# Patient Record
Sex: Female | Born: 1955 | Race: Black or African American | Hispanic: No | Marital: Married | Smoking: Current some day smoker
Health system: Southern US, Community
[De-identification: ages and names within clinical notes are randomized; demographics above are authoritative.]

## PROBLEM LIST (undated history)

## (undated) DIAGNOSIS — R42 Dizziness and giddiness: Secondary | ICD-10-CM

## (undated) DIAGNOSIS — I639 Cerebral infarction, unspecified: Secondary | ICD-10-CM

## (undated) DIAGNOSIS — M5136 Other intervertebral disc degeneration, lumbar region: Secondary | ICD-10-CM

## (undated) DIAGNOSIS — I10 Essential (primary) hypertension: Secondary | ICD-10-CM

---

## 1998-09-05 ENCOUNTER — Ambulatory Visit (HOSPITAL_COMMUNITY): Admission: RE | Admit: 1998-09-05 | Discharge: 1998-09-05 | Payer: Self-pay | Admitting: Family Medicine

## 1998-09-05 ENCOUNTER — Encounter: Payer: Self-pay | Admitting: Family Medicine

## 1998-11-08 ENCOUNTER — Encounter: Payer: Self-pay | Admitting: Family Medicine

## 1998-11-08 ENCOUNTER — Ambulatory Visit (HOSPITAL_COMMUNITY): Admission: RE | Admit: 1998-11-08 | Discharge: 1998-11-08 | Payer: Self-pay | Admitting: Family Medicine

## 1998-11-21 ENCOUNTER — Ambulatory Visit (HOSPITAL_COMMUNITY): Admission: RE | Admit: 1998-11-21 | Discharge: 1998-11-21 | Payer: Self-pay | Admitting: Family Medicine

## 1998-11-21 ENCOUNTER — Encounter: Payer: Self-pay | Admitting: Family Medicine

## 1999-02-23 ENCOUNTER — Emergency Department (HOSPITAL_COMMUNITY): Admission: EM | Admit: 1999-02-23 | Discharge: 1999-02-23 | Payer: Self-pay | Admitting: Emergency Medicine

## 1999-05-28 ENCOUNTER — Ambulatory Visit (HOSPITAL_COMMUNITY): Admission: RE | Admit: 1999-05-28 | Discharge: 1999-05-28 | Payer: Self-pay | Admitting: Family Medicine

## 1999-05-28 ENCOUNTER — Encounter: Payer: Self-pay | Admitting: Family Medicine

## 2001-11-09 ENCOUNTER — Encounter: Payer: Self-pay | Admitting: Emergency Medicine

## 2001-11-09 ENCOUNTER — Emergency Department (HOSPITAL_COMMUNITY): Admission: EM | Admit: 2001-11-09 | Discharge: 2001-11-09 | Payer: Self-pay | Admitting: Emergency Medicine

## 2002-08-19 ENCOUNTER — Emergency Department (HOSPITAL_COMMUNITY): Admission: EM | Admit: 2002-08-19 | Discharge: 2002-08-19 | Payer: Self-pay | Admitting: *Deleted

## 2002-08-19 ENCOUNTER — Encounter: Payer: Self-pay | Admitting: *Deleted

## 2002-09-24 ENCOUNTER — Encounter: Payer: Self-pay | Admitting: Emergency Medicine

## 2002-09-25 ENCOUNTER — Inpatient Hospital Stay (HOSPITAL_COMMUNITY): Admission: EM | Admit: 2002-09-25 | Discharge: 2002-09-27 | Payer: Self-pay | Admitting: Emergency Medicine

## 2002-09-25 ENCOUNTER — Encounter: Payer: Self-pay | Admitting: Family Medicine

## 2002-10-31 ENCOUNTER — Ambulatory Visit (HOSPITAL_COMMUNITY): Admission: RE | Admit: 2002-10-31 | Discharge: 2002-10-31 | Payer: Self-pay | Admitting: Family Medicine

## 2002-10-31 ENCOUNTER — Encounter: Payer: Self-pay | Admitting: Family Medicine

## 2002-11-03 ENCOUNTER — Emergency Department (HOSPITAL_COMMUNITY): Admission: EM | Admit: 2002-11-03 | Discharge: 2002-11-03 | Payer: Self-pay | Admitting: Emergency Medicine

## 2002-11-05 ENCOUNTER — Encounter: Payer: Self-pay | Admitting: Emergency Medicine

## 2002-11-05 ENCOUNTER — Emergency Department (HOSPITAL_COMMUNITY): Admission: EM | Admit: 2002-11-05 | Discharge: 2002-11-05 | Payer: Self-pay | Admitting: Emergency Medicine

## 2002-11-09 ENCOUNTER — Inpatient Hospital Stay (HOSPITAL_COMMUNITY): Admission: EM | Admit: 2002-11-09 | Discharge: 2002-11-13 | Payer: Self-pay | Admitting: Emergency Medicine

## 2002-11-09 ENCOUNTER — Encounter: Payer: Self-pay | Admitting: Emergency Medicine

## 2002-11-10 ENCOUNTER — Encounter: Payer: Self-pay | Admitting: Internal Medicine

## 2002-11-17 ENCOUNTER — Encounter: Admission: RE | Admit: 2002-11-17 | Discharge: 2002-11-17 | Payer: Self-pay | Admitting: Family Medicine

## 2002-11-17 ENCOUNTER — Emergency Department (HOSPITAL_COMMUNITY): Admission: EM | Admit: 2002-11-17 | Discharge: 2002-11-17 | Payer: Self-pay

## 2002-11-17 ENCOUNTER — Encounter: Admission: EM | Admit: 2002-11-17 | Discharge: 2002-11-17 | Payer: Self-pay | Admitting: Dentistry

## 2002-11-19 ENCOUNTER — Emergency Department (HOSPITAL_COMMUNITY): Admission: EM | Admit: 2002-11-19 | Discharge: 2002-11-19 | Payer: Self-pay | Admitting: Emergency Medicine

## 2002-11-19 ENCOUNTER — Encounter: Payer: Self-pay | Admitting: Emergency Medicine

## 2002-11-24 ENCOUNTER — Encounter: Admission: RE | Admit: 2002-11-24 | Discharge: 2002-11-24 | Payer: Self-pay | Admitting: Family Medicine

## 2002-11-24 ENCOUNTER — Encounter: Payer: Self-pay | Admitting: Family Medicine

## 2002-11-30 ENCOUNTER — Encounter: Payer: Self-pay | Admitting: Family Medicine

## 2002-11-30 ENCOUNTER — Encounter: Admission: RE | Admit: 2002-11-30 | Discharge: 2002-11-30 | Payer: Self-pay | Admitting: Family Medicine

## 2002-12-14 ENCOUNTER — Encounter: Payer: Self-pay | Admitting: Family Medicine

## 2002-12-14 ENCOUNTER — Encounter: Admission: RE | Admit: 2002-12-14 | Discharge: 2002-12-14 | Payer: Self-pay | Admitting: Family Medicine

## 2003-01-03 ENCOUNTER — Encounter: Payer: Self-pay | Admitting: Family Medicine

## 2003-01-03 ENCOUNTER — Encounter: Admission: RE | Admit: 2003-01-03 | Discharge: 2003-01-03 | Payer: Self-pay | Admitting: Family Medicine

## 2003-01-13 ENCOUNTER — Emergency Department (HOSPITAL_COMMUNITY): Admission: EM | Admit: 2003-01-13 | Discharge: 2003-01-13 | Payer: Self-pay | Admitting: Emergency Medicine

## 2003-01-13 ENCOUNTER — Encounter: Payer: Self-pay | Admitting: Emergency Medicine

## 2003-01-25 ENCOUNTER — Emergency Department (HOSPITAL_COMMUNITY): Admission: EM | Admit: 2003-01-25 | Discharge: 2003-01-25 | Payer: Self-pay | Admitting: Emergency Medicine

## 2003-01-31 ENCOUNTER — Encounter: Payer: Self-pay | Admitting: Family Medicine

## 2003-01-31 ENCOUNTER — Encounter: Admission: RE | Admit: 2003-01-31 | Discharge: 2003-01-31 | Payer: Self-pay | Admitting: Family Medicine

## 2003-02-02 ENCOUNTER — Emergency Department (HOSPITAL_COMMUNITY): Admission: EM | Admit: 2003-02-02 | Discharge: 2003-02-02 | Payer: Self-pay | Admitting: Emergency Medicine

## 2003-02-20 ENCOUNTER — Emergency Department (HOSPITAL_COMMUNITY): Admission: EM | Admit: 2003-02-20 | Discharge: 2003-02-20 | Payer: Self-pay | Admitting: Emergency Medicine

## 2003-02-22 ENCOUNTER — Ambulatory Visit (HOSPITAL_COMMUNITY): Admission: RE | Admit: 2003-02-22 | Discharge: 2003-02-22 | Payer: Self-pay | Admitting: Neurology

## 2003-03-16 ENCOUNTER — Ambulatory Visit (HOSPITAL_COMMUNITY): Admission: RE | Admit: 2003-03-16 | Discharge: 2003-03-16 | Payer: Self-pay | Admitting: Family Medicine

## 2003-03-16 ENCOUNTER — Encounter: Payer: Self-pay | Admitting: Family Medicine

## 2003-06-19 ENCOUNTER — Inpatient Hospital Stay (HOSPITAL_COMMUNITY): Admission: AD | Admit: 2003-06-19 | Discharge: 2003-06-20 | Payer: Self-pay | Admitting: Obstetrics and Gynecology

## 2003-06-19 ENCOUNTER — Encounter: Payer: Self-pay | Admitting: Emergency Medicine

## 2003-06-26 ENCOUNTER — Inpatient Hospital Stay (HOSPITAL_COMMUNITY): Admission: AD | Admit: 2003-06-26 | Discharge: 2003-06-26 | Payer: Self-pay | Admitting: Obstetrics & Gynecology

## 2003-06-27 ENCOUNTER — Ambulatory Visit: Admission: RE | Admit: 2003-06-27 | Discharge: 2003-06-27 | Payer: Self-pay | Admitting: Gynecology

## 2003-07-04 ENCOUNTER — Encounter (INDEPENDENT_AMBULATORY_CARE_PROVIDER_SITE_OTHER): Payer: Self-pay | Admitting: Specialist

## 2003-07-04 ENCOUNTER — Inpatient Hospital Stay (HOSPITAL_COMMUNITY): Admission: RE | Admit: 2003-07-04 | Discharge: 2003-07-07 | Payer: Self-pay | Admitting: Obstetrics & Gynecology

## 2003-07-11 ENCOUNTER — Inpatient Hospital Stay (HOSPITAL_COMMUNITY): Admission: AD | Admit: 2003-07-11 | Discharge: 2003-07-11 | Payer: Self-pay | Admitting: *Deleted

## 2003-07-19 ENCOUNTER — Inpatient Hospital Stay (HOSPITAL_COMMUNITY): Admission: AD | Admit: 2003-07-19 | Discharge: 2003-07-19 | Payer: Self-pay | Admitting: Obstetrics and Gynecology

## 2003-07-28 ENCOUNTER — Encounter: Admission: RE | Admit: 2003-07-28 | Discharge: 2003-07-28 | Payer: Self-pay | Admitting: Family Medicine

## 2003-09-13 ENCOUNTER — Inpatient Hospital Stay (HOSPITAL_COMMUNITY): Admission: AD | Admit: 2003-09-13 | Discharge: 2003-09-13 | Payer: Self-pay | Admitting: Obstetrics & Gynecology

## 2003-10-25 ENCOUNTER — Emergency Department (HOSPITAL_COMMUNITY): Admission: EM | Admit: 2003-10-25 | Discharge: 2003-10-25 | Payer: Self-pay | Admitting: *Deleted

## 2003-10-31 ENCOUNTER — Ambulatory Visit (HOSPITAL_COMMUNITY): Admission: RE | Admit: 2003-10-31 | Discharge: 2003-10-31 | Payer: Self-pay | Admitting: Family Medicine

## 2003-11-03 ENCOUNTER — Emergency Department (HOSPITAL_COMMUNITY): Admission: EM | Admit: 2003-11-03 | Discharge: 2003-11-04 | Payer: Self-pay | Admitting: Emergency Medicine

## 2003-11-19 ENCOUNTER — Emergency Department (HOSPITAL_COMMUNITY): Admission: EM | Admit: 2003-11-19 | Discharge: 2003-11-19 | Payer: Self-pay | Admitting: Emergency Medicine

## 2003-11-22 ENCOUNTER — Emergency Department (HOSPITAL_COMMUNITY): Admission: EM | Admit: 2003-11-22 | Discharge: 2003-11-22 | Payer: Self-pay | Admitting: Emergency Medicine

## 2003-12-04 ENCOUNTER — Emergency Department (HOSPITAL_COMMUNITY): Admission: EM | Admit: 2003-12-04 | Discharge: 2003-12-04 | Payer: Self-pay | Admitting: Emergency Medicine

## 2003-12-07 ENCOUNTER — Encounter: Admission: RE | Admit: 2003-12-07 | Discharge: 2003-12-07 | Payer: Self-pay | Admitting: Family Medicine

## 2003-12-11 ENCOUNTER — Emergency Department (HOSPITAL_COMMUNITY): Admission: EM | Admit: 2003-12-11 | Discharge: 2003-12-12 | Payer: Self-pay

## 2003-12-15 ENCOUNTER — Emergency Department (HOSPITAL_COMMUNITY): Admission: EM | Admit: 2003-12-15 | Discharge: 2003-12-16 | Payer: Self-pay | Admitting: Emergency Medicine

## 2004-01-04 ENCOUNTER — Encounter: Admission: RE | Admit: 2004-01-04 | Discharge: 2004-02-06 | Payer: Self-pay | Admitting: Family Medicine

## 2004-01-14 ENCOUNTER — Emergency Department (HOSPITAL_COMMUNITY): Admission: EM | Admit: 2004-01-14 | Discharge: 2004-01-14 | Payer: Self-pay | Admitting: Emergency Medicine

## 2004-01-24 ENCOUNTER — Emergency Department (HOSPITAL_COMMUNITY): Admission: EM | Admit: 2004-01-24 | Discharge: 2004-01-25 | Payer: Self-pay | Admitting: Emergency Medicine

## 2004-01-26 ENCOUNTER — Emergency Department (HOSPITAL_COMMUNITY): Admission: EM | Admit: 2004-01-26 | Discharge: 2004-01-26 | Payer: Self-pay

## 2004-01-26 ENCOUNTER — Ambulatory Visit: Payer: Self-pay | Admitting: Family Medicine

## 2004-01-30 ENCOUNTER — Ambulatory Visit: Payer: Self-pay | Admitting: Family Medicine

## 2004-01-30 ENCOUNTER — Ambulatory Visit: Payer: Self-pay | Admitting: *Deleted

## 2004-02-01 ENCOUNTER — Emergency Department (HOSPITAL_COMMUNITY): Admission: EM | Admit: 2004-02-01 | Discharge: 2004-02-01 | Payer: Self-pay | Admitting: Emergency Medicine

## 2004-02-02 ENCOUNTER — Ambulatory Visit (HOSPITAL_COMMUNITY): Admission: RE | Admit: 2004-02-02 | Discharge: 2004-02-02 | Payer: Self-pay | Admitting: Family Medicine

## 2004-02-11 ENCOUNTER — Emergency Department (HOSPITAL_COMMUNITY): Admission: EM | Admit: 2004-02-11 | Discharge: 2004-02-12 | Payer: Self-pay | Admitting: Emergency Medicine

## 2004-02-15 ENCOUNTER — Encounter: Admission: RE | Admit: 2004-02-15 | Discharge: 2004-04-12 | Payer: Self-pay | Admitting: Family Medicine

## 2004-02-16 ENCOUNTER — Emergency Department (HOSPITAL_COMMUNITY): Admission: EM | Admit: 2004-02-16 | Discharge: 2004-02-16 | Payer: Self-pay | Admitting: Emergency Medicine

## 2004-02-19 ENCOUNTER — Emergency Department (HOSPITAL_COMMUNITY): Admission: EM | Admit: 2004-02-19 | Discharge: 2004-02-20 | Payer: Self-pay | Admitting: *Deleted

## 2004-02-22 ENCOUNTER — Emergency Department (HOSPITAL_COMMUNITY): Admission: EM | Admit: 2004-02-22 | Discharge: 2004-02-22 | Payer: Self-pay | Admitting: Family Medicine

## 2004-02-24 ENCOUNTER — Emergency Department (HOSPITAL_COMMUNITY): Admission: EM | Admit: 2004-02-24 | Discharge: 2004-02-24 | Payer: Self-pay | Admitting: Family Medicine

## 2004-04-12 ENCOUNTER — Ambulatory Visit: Payer: Self-pay | Admitting: Family Medicine

## 2004-05-24 ENCOUNTER — Emergency Department (HOSPITAL_COMMUNITY): Admission: EM | Admit: 2004-05-24 | Discharge: 2004-05-24 | Payer: Self-pay | Admitting: *Deleted

## 2004-06-14 ENCOUNTER — Ambulatory Visit: Payer: Self-pay | Admitting: Family Medicine

## 2004-06-21 ENCOUNTER — Ambulatory Visit: Payer: Self-pay | Admitting: Family Medicine

## 2004-06-24 ENCOUNTER — Ambulatory Visit: Payer: Self-pay | Admitting: Family Medicine

## 2004-07-02 ENCOUNTER — Ambulatory Visit: Payer: Self-pay | Admitting: Family Medicine

## 2004-07-08 ENCOUNTER — Emergency Department (HOSPITAL_COMMUNITY): Admission: EM | Admit: 2004-07-08 | Discharge: 2004-07-08 | Payer: Self-pay | Admitting: Emergency Medicine

## 2004-07-10 ENCOUNTER — Ambulatory Visit: Payer: Self-pay | Admitting: Family Medicine

## 2004-07-11 ENCOUNTER — Ambulatory Visit (HOSPITAL_COMMUNITY): Admission: RE | Admit: 2004-07-11 | Discharge: 2004-07-11 | Payer: Self-pay | Admitting: Family Medicine

## 2004-07-11 ENCOUNTER — Emergency Department (HOSPITAL_COMMUNITY): Admission: EM | Admit: 2004-07-11 | Discharge: 2004-07-11 | Payer: Self-pay | Admitting: *Deleted

## 2004-07-17 ENCOUNTER — Ambulatory Visit: Payer: Self-pay | Admitting: Internal Medicine

## 2004-07-17 ENCOUNTER — Observation Stay (HOSPITAL_COMMUNITY): Admission: EM | Admit: 2004-07-17 | Discharge: 2004-07-18 | Payer: Self-pay | Admitting: Emergency Medicine

## 2004-07-18 ENCOUNTER — Encounter (INDEPENDENT_AMBULATORY_CARE_PROVIDER_SITE_OTHER): Payer: Self-pay | Admitting: *Deleted

## 2004-07-23 ENCOUNTER — Ambulatory Visit: Payer: Self-pay | Admitting: Family Medicine

## 2004-08-09 ENCOUNTER — Emergency Department (HOSPITAL_COMMUNITY): Admission: EM | Admit: 2004-08-09 | Discharge: 2004-08-09 | Payer: Self-pay | Admitting: Family Medicine

## 2004-08-15 IMAGING — US US TRANSVAGINAL NON-OB
1 series · 17 of 25 positions shown · non-contrast
Comparison: none

CLINICAL DATA: Left sided pelvic mass.  
TRANSABDOMINAL AND ENDOVAGINAL PELVIC ULTRASOUND:
Correlation is made with the previous CT on 06/19/03.
Multiple images of the pelvis were obtained using a transabdominal and endovaginal approaches.   The patient is status post hysterectomy in 8348 and right oophorectomy in 1334.  The patient currently has left lower quadrant pain.
There is a complex left adnexal mass identified which correlates with the CT finding and measures 9.6 x 10.0 x 7.6 cm.  This contains multiple thin septations.  Color flow is identified within the septations and we were able to measure a resistive index on one of the septations which measures .67.  This would be more compatible with a benign etiology.  There is a solitary mural nodule identified which measures 2.2 x 2.1 x 1.6 cm and appears contiguous with one of the septations.  No color flow was identified within this area of nodularity to allow interrogation.  Separate left ovary could not be identified and while no surrounding normal ovarian tissue can be clearly demonstrated, the location would suggest that this is likely ovarian in etiology.  While the resistive index in the septations is reassuring, because of the presence of a mural soft tissue nodule combined with the large size and complex nature, further evaluation with surgical assessment would be recommended.  Differential considerations for this finding would include mucinous or serous cystadenoma/cystadenocarcinoma or atypical dermoid.  Non gynecological etiologies are felt less likely given the appearance by ultrasound and CT, but a postoperative process such as an atypical lympocele would be a consideration as well.
IMPRESSION
Complex left adnexal mass as described above.  Sonographic features are felt most likely to be related to the left ovary and are concerning for the presence of an underlying neoplastic process and because of size and the presence of mural nodularity, correlation with [id number] and consideration for surgical evaluation would be recommended.

[Series 1: us transvaginal non-ob · 17 of 58 slices shown]
[im 1/58]
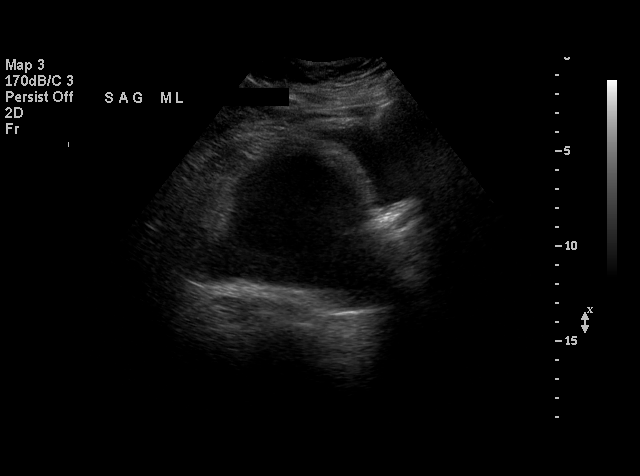
[im 5/58]
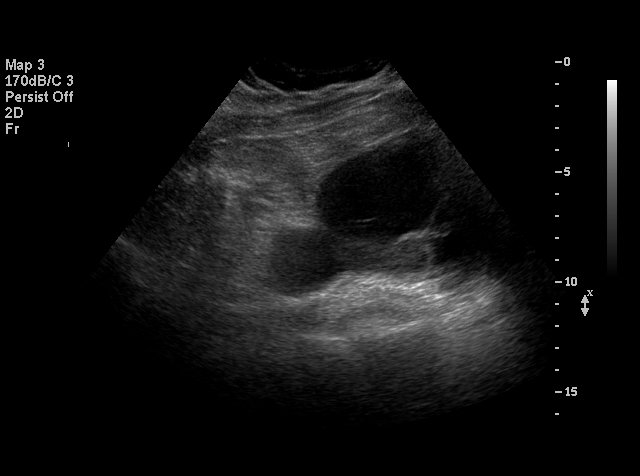
[im 8/58]
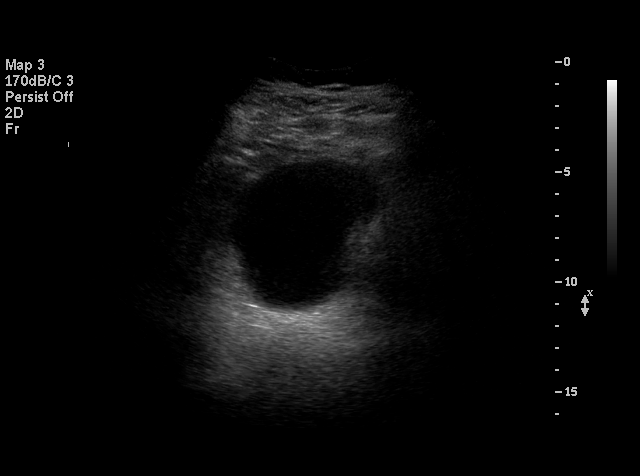
[im 12/58]
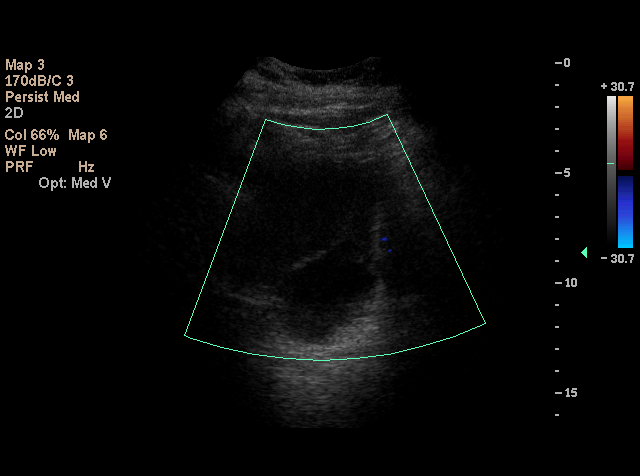
[im 15/58]
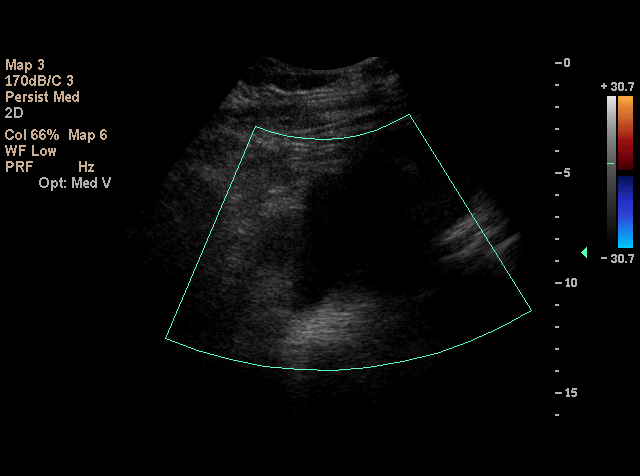
[im 20/58]
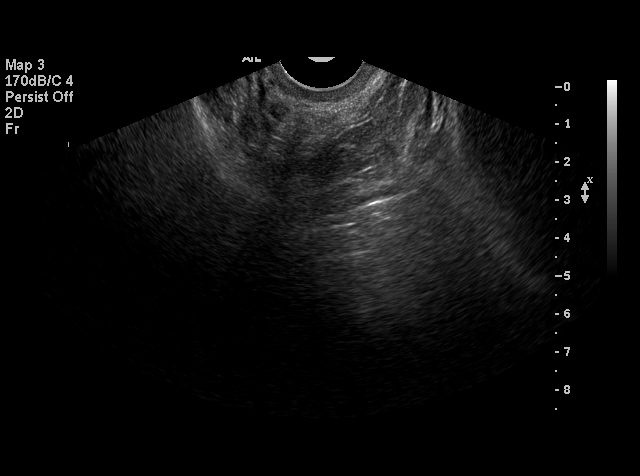
[im 22/58]
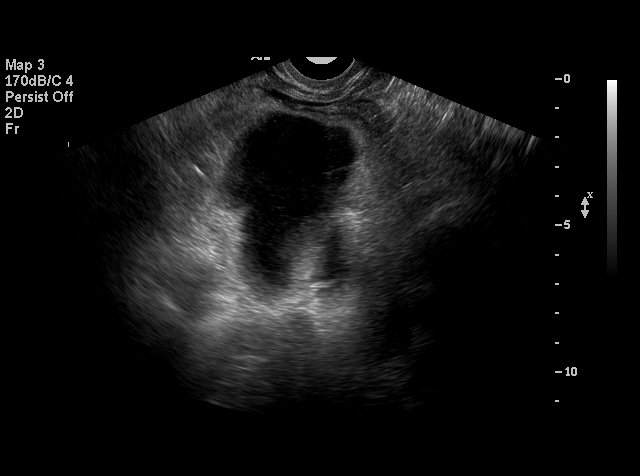
[im 27/58]
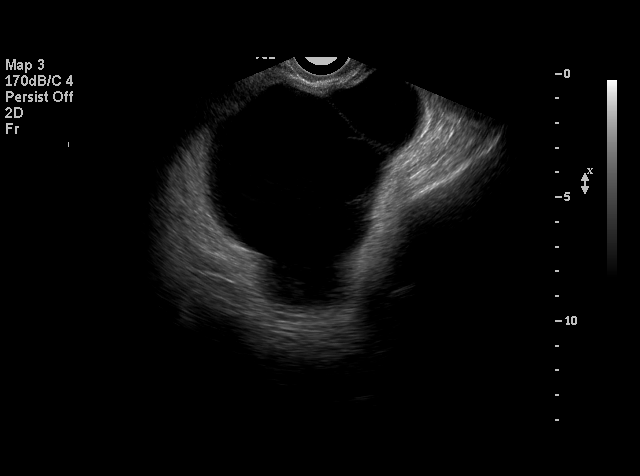
[im 29/58]
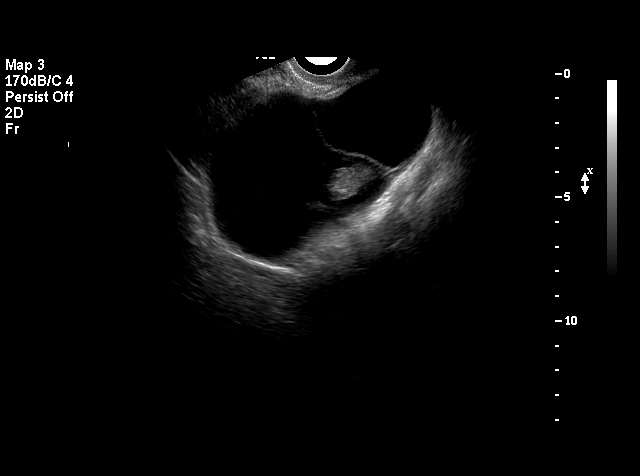
[im 31/58]
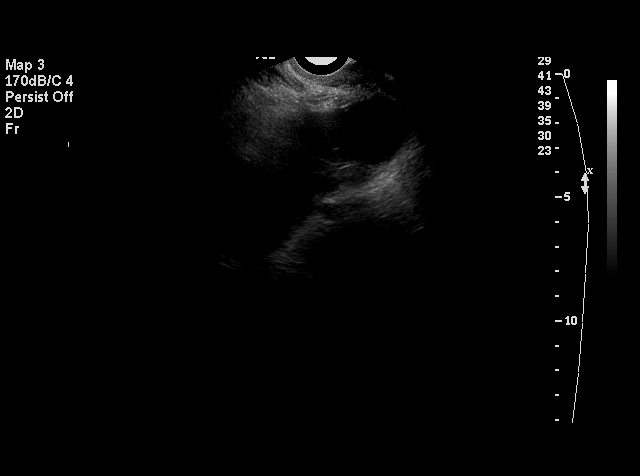
[im 36/58]
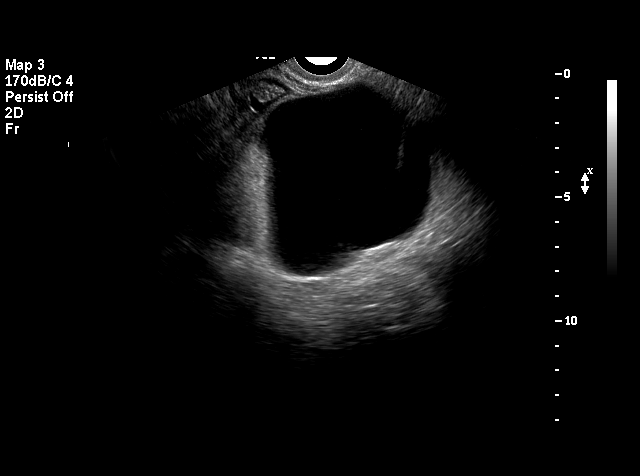
[im 39/58]
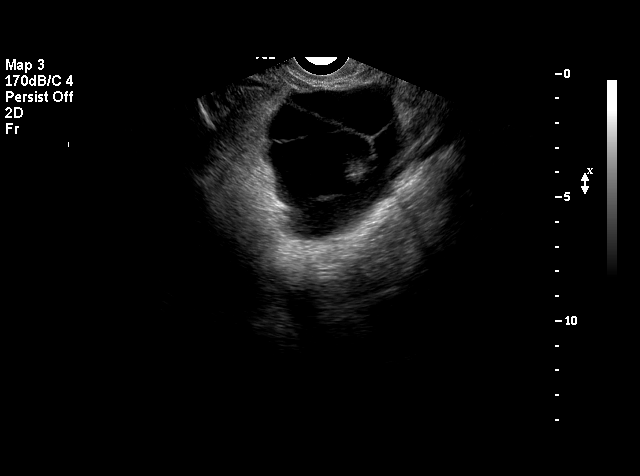
[im 43/58]
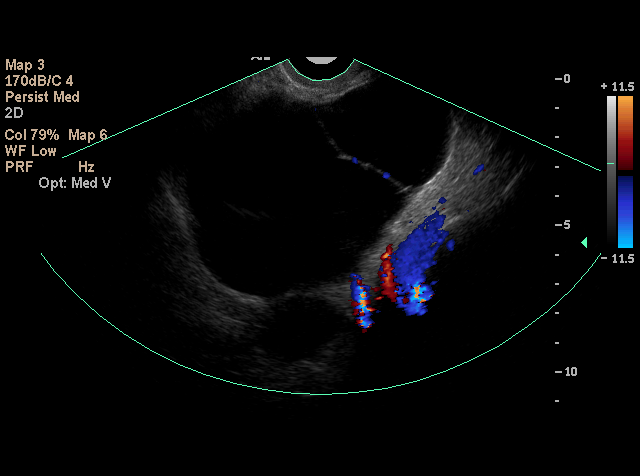
[im 46/58]
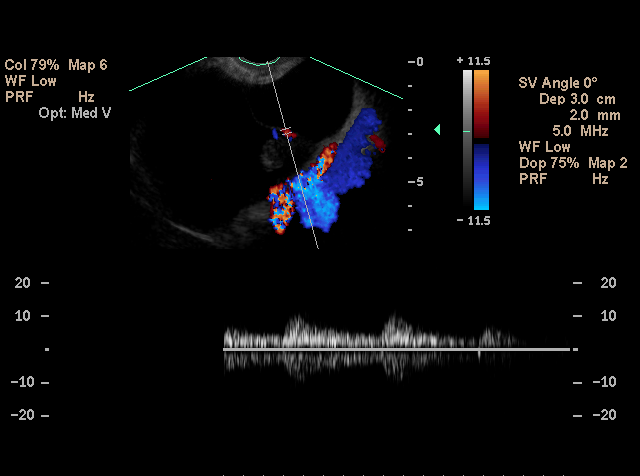
[im 50/58]
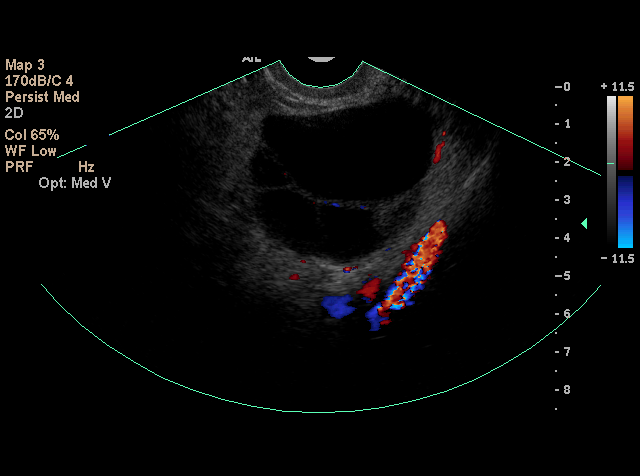
[im 53/58]
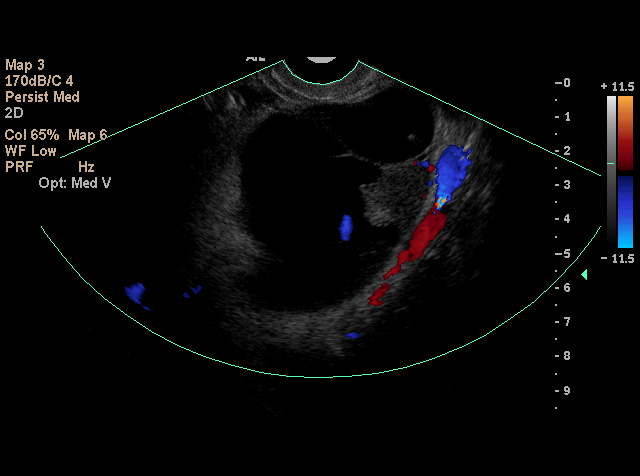
[im 58/58]
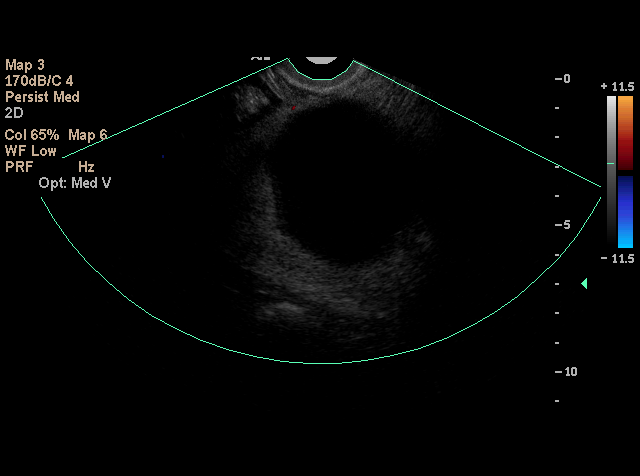

[17 of 25 positions shown; findings below may reference images not displayed]

## 2004-08-16 ENCOUNTER — Ambulatory Visit: Payer: Self-pay | Admitting: Internal Medicine

## 2004-08-17 ENCOUNTER — Emergency Department (HOSPITAL_COMMUNITY): Admission: EM | Admit: 2004-08-17 | Discharge: 2004-08-17 | Payer: Self-pay | Admitting: Family Medicine

## 2004-08-22 ENCOUNTER — Emergency Department (HOSPITAL_COMMUNITY): Admission: EM | Admit: 2004-08-22 | Discharge: 2004-08-23 | Payer: Self-pay | Admitting: Emergency Medicine

## 2004-08-23 ENCOUNTER — Ambulatory Visit: Payer: Self-pay | Admitting: Family Medicine

## 2004-08-27 ENCOUNTER — Ambulatory Visit: Payer: Self-pay | Admitting: Internal Medicine

## 2004-08-30 ENCOUNTER — Ambulatory Visit: Payer: Self-pay | Admitting: Family Medicine

## 2004-09-09 ENCOUNTER — Ambulatory Visit: Payer: Self-pay | Admitting: Family Medicine

## 2004-09-11 ENCOUNTER — Emergency Department (HOSPITAL_COMMUNITY): Admission: EM | Admit: 2004-09-11 | Discharge: 2004-09-11 | Payer: Self-pay | Admitting: Emergency Medicine

## 2004-09-18 ENCOUNTER — Ambulatory Visit: Payer: Self-pay | Admitting: Internal Medicine

## 2004-09-19 ENCOUNTER — Ambulatory Visit (HOSPITAL_COMMUNITY): Admission: RE | Admit: 2004-09-19 | Discharge: 2004-09-19 | Payer: Self-pay | Admitting: Internal Medicine

## 2004-10-02 ENCOUNTER — Ambulatory Visit: Payer: Self-pay | Admitting: Family Medicine

## 2004-10-03 ENCOUNTER — Ambulatory Visit (HOSPITAL_COMMUNITY): Admission: RE | Admit: 2004-10-03 | Discharge: 2004-10-03 | Payer: Self-pay | Admitting: Otolaryngology

## 2004-10-10 ENCOUNTER — Emergency Department (HOSPITAL_COMMUNITY): Admission: EM | Admit: 2004-10-10 | Discharge: 2004-10-10 | Payer: Self-pay | Admitting: Emergency Medicine

## 2004-10-11 ENCOUNTER — Ambulatory Visit: Payer: Self-pay | Admitting: Family Medicine

## 2004-10-22 ENCOUNTER — Ambulatory Visit: Payer: Self-pay | Admitting: Internal Medicine

## 2004-10-30 ENCOUNTER — Ambulatory Visit: Payer: Self-pay | Admitting: Family Medicine

## 2004-11-03 ENCOUNTER — Emergency Department (HOSPITAL_COMMUNITY): Admission: EM | Admit: 2004-11-03 | Discharge: 2004-11-03 | Payer: Self-pay | Admitting: Emergency Medicine

## 2004-11-04 ENCOUNTER — Inpatient Hospital Stay (HOSPITAL_COMMUNITY): Admission: AD | Admit: 2004-11-04 | Discharge: 2004-11-04 | Payer: Self-pay | Admitting: *Deleted

## 2004-11-14 ENCOUNTER — Ambulatory Visit: Payer: Self-pay | Admitting: Family Medicine

## 2004-11-15 ENCOUNTER — Emergency Department (HOSPITAL_COMMUNITY): Admission: EM | Admit: 2004-11-15 | Discharge: 2004-11-15 | Payer: Self-pay | Admitting: Family Medicine

## 2004-11-18 ENCOUNTER — Ambulatory Visit (HOSPITAL_COMMUNITY): Admission: RE | Admit: 2004-11-18 | Discharge: 2004-11-18 | Payer: Self-pay | Admitting: Family Medicine

## 2004-11-21 ENCOUNTER — Ambulatory Visit: Payer: Self-pay | Admitting: Family Medicine

## 2004-11-27 ENCOUNTER — Encounter: Admission: RE | Admit: 2004-11-27 | Discharge: 2004-11-27 | Payer: Self-pay | Admitting: Family Medicine

## 2004-12-02 ENCOUNTER — Ambulatory Visit: Payer: Self-pay | Admitting: Family Medicine

## 2005-01-28 ENCOUNTER — Emergency Department (HOSPITAL_COMMUNITY): Admission: EM | Admit: 2005-01-28 | Discharge: 2005-01-28 | Payer: Self-pay | Admitting: Family Medicine

## 2005-01-29 ENCOUNTER — Emergency Department (HOSPITAL_COMMUNITY): Admission: EM | Admit: 2005-01-29 | Discharge: 2005-01-29 | Payer: Self-pay | Admitting: Emergency Medicine

## 2005-02-07 ENCOUNTER — Ambulatory Visit: Payer: Self-pay | Admitting: Family Medicine

## 2005-02-10 ENCOUNTER — Emergency Department (HOSPITAL_COMMUNITY): Admission: EM | Admit: 2005-02-10 | Discharge: 2005-02-10 | Payer: Self-pay | Admitting: Family Medicine

## 2005-02-21 ENCOUNTER — Ambulatory Visit: Payer: Self-pay | Admitting: Family Medicine

## 2005-02-26 ENCOUNTER — Ambulatory Visit (HOSPITAL_COMMUNITY): Admission: RE | Admit: 2005-02-26 | Discharge: 2005-02-26 | Payer: Self-pay | Admitting: Family Medicine

## 2005-03-07 ENCOUNTER — Ambulatory Visit: Payer: Self-pay | Admitting: Family Medicine

## 2005-04-01 ENCOUNTER — Encounter: Admission: RE | Admit: 2005-04-01 | Discharge: 2005-06-30 | Payer: Self-pay | Admitting: Family Medicine

## 2005-04-14 ENCOUNTER — Ambulatory Visit: Payer: Self-pay | Admitting: Family Medicine

## 2005-04-29 ENCOUNTER — Ambulatory Visit: Payer: Self-pay | Admitting: Family Medicine

## 2005-05-28 ENCOUNTER — Emergency Department (HOSPITAL_COMMUNITY): Admission: EM | Admit: 2005-05-28 | Discharge: 2005-05-28 | Payer: Self-pay | Admitting: Emergency Medicine

## 2005-07-03 ENCOUNTER — Ambulatory Visit: Payer: Self-pay | Admitting: Family Medicine

## 2005-08-20 ENCOUNTER — Ambulatory Visit: Payer: Self-pay | Admitting: Family Medicine

## 2005-09-13 IMAGING — CT CT HEAD W/O CM
1 of 2 series · 13 of 30 positions shown, 17 images · non-contrast
Comparison: 07/11/04.

CLINICAL DATA: Seizure. 
 HEAD CT, 07/17/04:
TECHNIQUE: Contiguous axial CT images were obtained from the skull base through the vertex without contrast. 
 No evidence of acute intracranial abnormality including hemorrhage, infarct, mass, mass effect, midline shift or abnormal extra-axial fluid collections identified.  Bone windows are negative.

[Series 2: brain · axial · 0.47mm/px · z∈[+134,+262]mm · 13 of 32 slices shown, 17 images]
[im 3/32  brain]
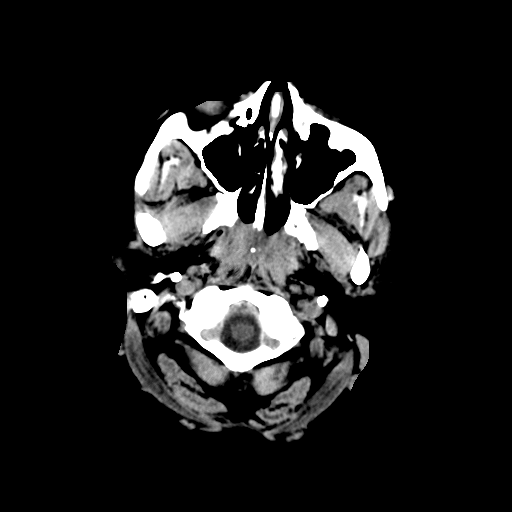
[im 3/32  bone]
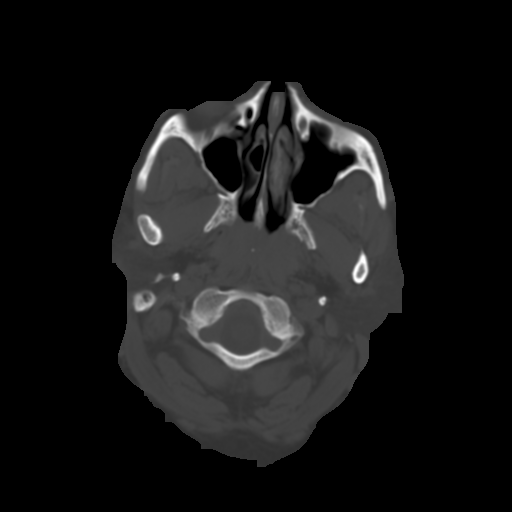
[im 5/32  brain]
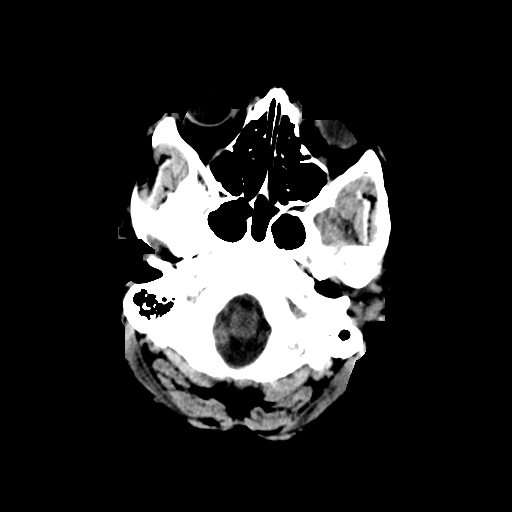
[im 7/32  brain]
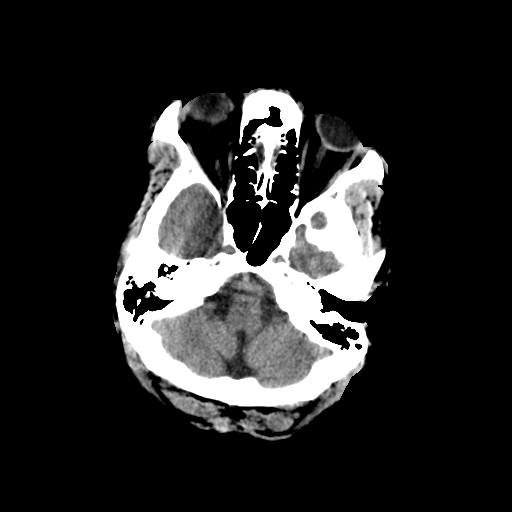
[im 9/32  brain]
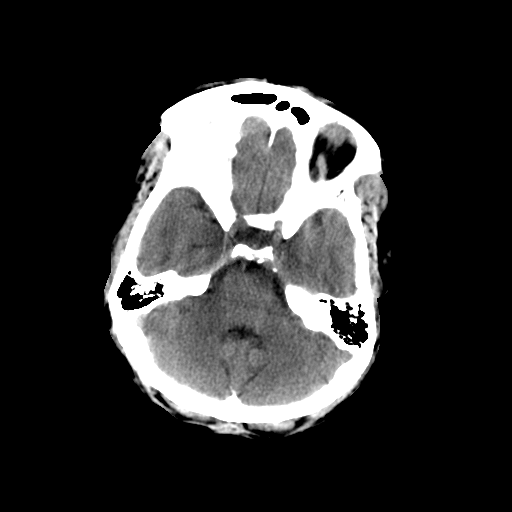
[im 12/32  brain]
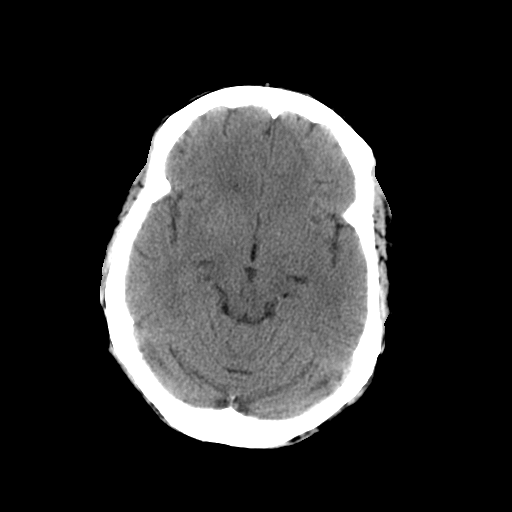
[im 12/32  bone]
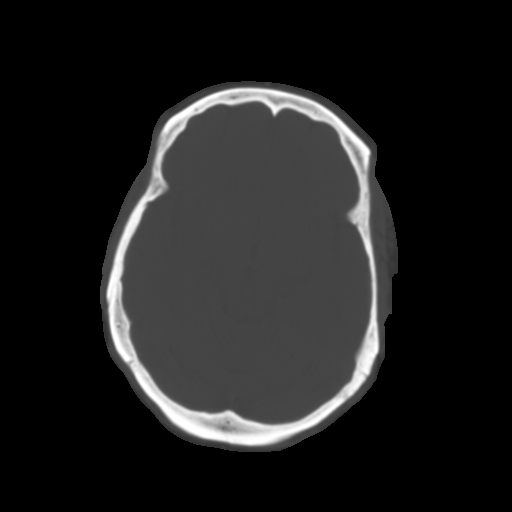
[im 14/32  brain]
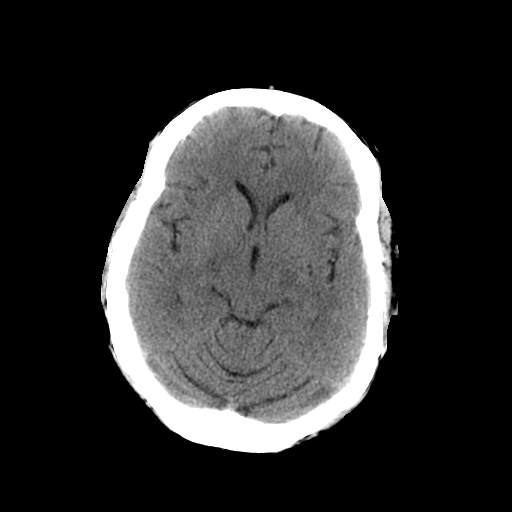
[im 16/32  brain]
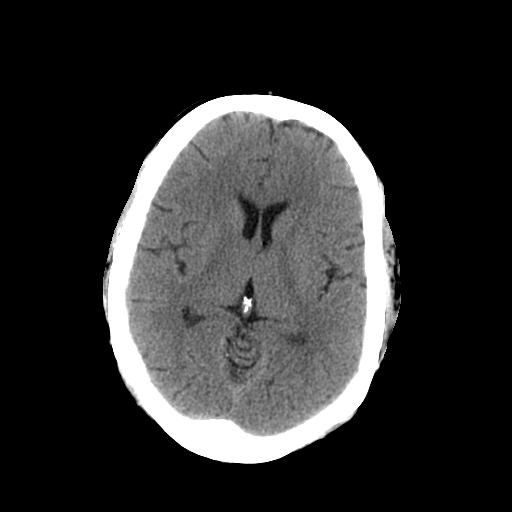
[im 18/32  brain]
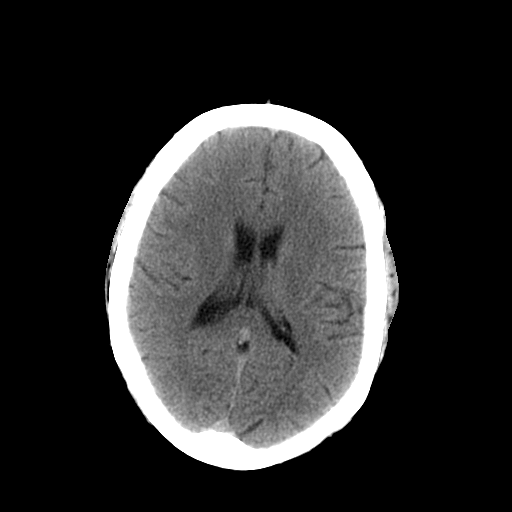
[im 20/32  brain]
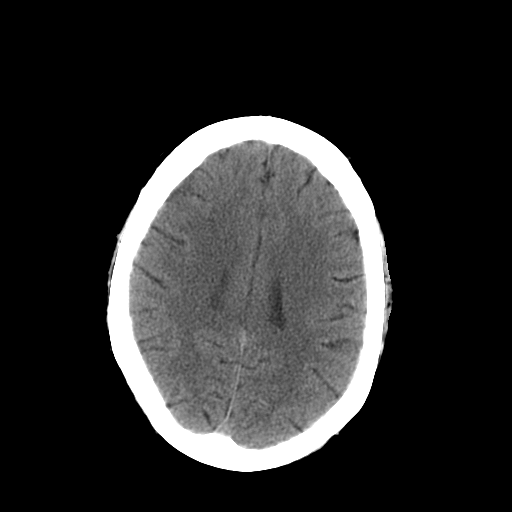
[im 20/32  bone]
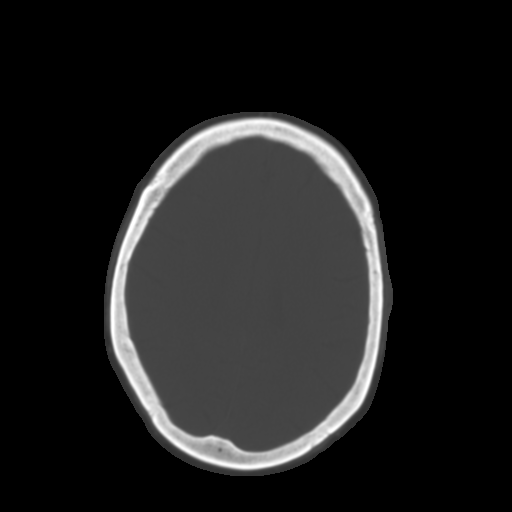
[im 23/32  brain]
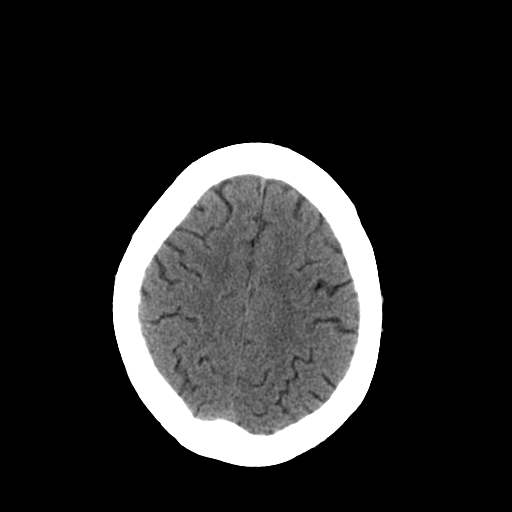
[im 25/32  brain]
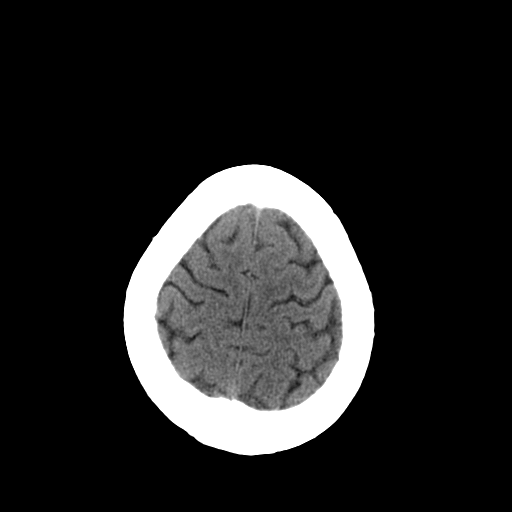
[im 27/32  brain]
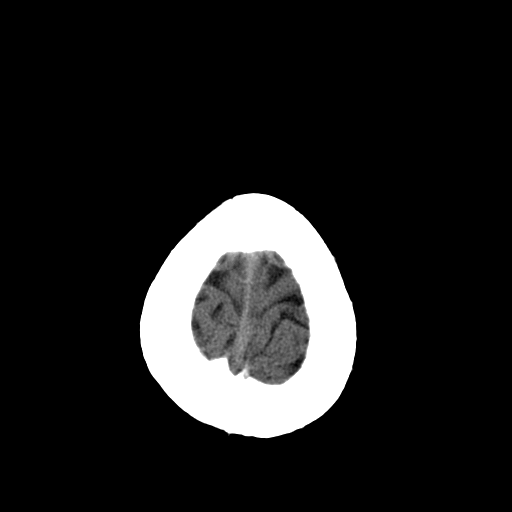
[im 29/32  brain]
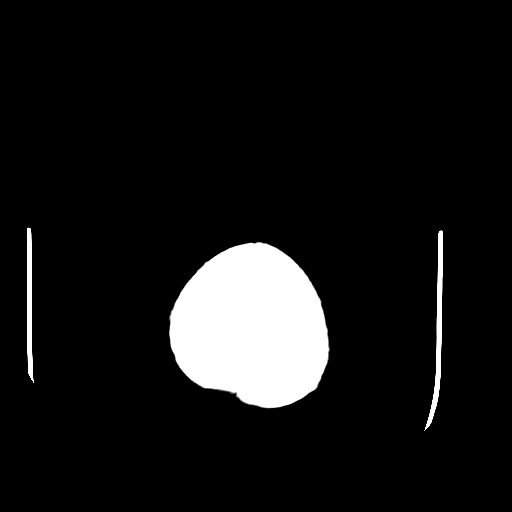
[im 29/32  bone]
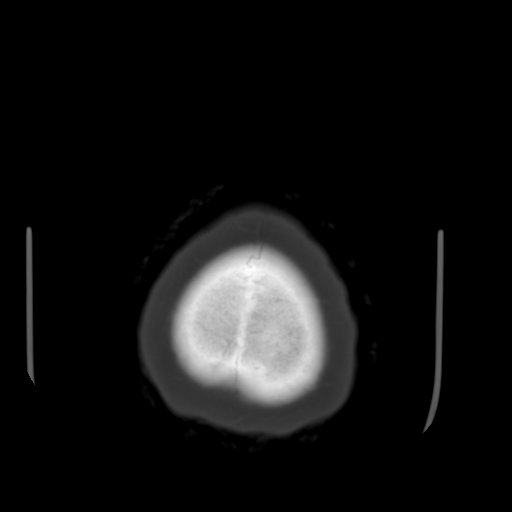

[13 of 30 positions shown; findings below may reference images not displayed]

IMPRESSION: Negative head CT.

## 2005-11-10 ENCOUNTER — Ambulatory Visit: Payer: Self-pay | Admitting: Family Medicine

## 2005-12-10 ENCOUNTER — Ambulatory Visit (HOSPITAL_BASED_OUTPATIENT_CLINIC_OR_DEPARTMENT_OTHER): Admission: RE | Admit: 2005-12-10 | Discharge: 2005-12-10 | Payer: Self-pay | Admitting: Family Medicine

## 2005-12-11 ENCOUNTER — Emergency Department (HOSPITAL_COMMUNITY): Admission: EM | Admit: 2005-12-11 | Discharge: 2005-12-12 | Payer: Self-pay | Admitting: Emergency Medicine

## 2005-12-14 ENCOUNTER — Ambulatory Visit: Payer: Self-pay | Admitting: Internal Medicine

## 2006-02-10 ENCOUNTER — Ambulatory Visit: Payer: Self-pay | Admitting: Family Medicine

## 2006-02-20 ENCOUNTER — Ambulatory Visit (HOSPITAL_COMMUNITY): Admission: RE | Admit: 2006-02-20 | Discharge: 2006-02-20 | Payer: Self-pay | Admitting: Family Medicine

## 2006-03-17 ENCOUNTER — Encounter: Admission: RE | Admit: 2006-03-17 | Discharge: 2006-03-17 | Payer: Self-pay | Admitting: Family Medicine

## 2006-09-02 ENCOUNTER — Encounter: Admission: RE | Admit: 2006-09-02 | Discharge: 2006-09-02 | Payer: Self-pay | Admitting: Internal Medicine

## 2007-05-01 ENCOUNTER — Ambulatory Visit (HOSPITAL_COMMUNITY): Admission: RE | Admit: 2007-05-01 | Discharge: 2007-05-01 | Payer: Self-pay | Admitting: Chiropractic Medicine

## 2009-06-07 ENCOUNTER — Emergency Department (HOSPITAL_BASED_OUTPATIENT_CLINIC_OR_DEPARTMENT_OTHER): Admission: EM | Admit: 2009-06-07 | Discharge: 2009-06-08 | Payer: Self-pay | Admitting: Emergency Medicine

## 2009-06-07 ENCOUNTER — Ambulatory Visit: Payer: Self-pay | Admitting: Diagnostic Radiology

## 2010-08-11 LAB — DIFFERENTIAL
Basophils Absolute: 0.1 10*3/uL (ref 0.0–0.1)
Basophils Relative: 1 % (ref 0–1)
Eosinophils Absolute: 0.1 10*3/uL (ref 0.0–0.7)
Eosinophils Relative: 1 % (ref 0–5)
Lymphocytes Relative: 50 % — ABNORMAL HIGH (ref 12–46)
Monocytes Absolute: 0.6 10*3/uL (ref 0.1–1.0)

## 2010-08-11 LAB — POCT CARDIAC MARKERS
CKMB, poc: 1.1 ng/mL (ref 1.0–8.0)
Troponin i, poc: <0.05 ng/mL (ref 0.00–0.09)

## 2010-08-11 LAB — BASIC METABOLIC PANEL
BUN: 11 mg/dL (ref 6–23)
Calcium: 9.6 mg/dL (ref 8.4–10.5)
Chloride: 106 mEq/L (ref 96–112)
Creatinine, Ser: 0.5 mg/dL (ref 0.4–1.2)
GFR calc Af Amer: >60 mL/min (ref >60)
GFR calc non Af Amer: >60 mL/min (ref >60)
Glucose, Bld: 145 mg/dL — ABNORMAL HIGH (ref 70–99)
Potassium: 3.5 mEq/L (ref 3.5–5.1)

## 2010-08-11 LAB — CBC: MCHC: 32.5 g/dL (ref 30.0–36.0)

## 2010-08-11 LAB — GLUCOSE, CAPILLARY: Glucose-Capillary: 154 mg/dL — ABNORMAL HIGH (ref 70–99)

## 2010-10-11 NOTE — Discharge Summary (Signed)
NAME:  Sheri Oconnell, Sheri Oconnell                        ACCOUNT NO.:  [id number]   MEDICAL RECORD NO.:  [id number]                   PATIENT TYPE:  INP   LOCATION:  3728                                 FACILITY:  [facility]   PHYSICIAN:  Billey Gosling, M.D.                 DATE OF BIRTH:  1955/06/13   DATE OF ADMISSION:  11/09/2002  DATE OF DISCHARGE:  11/13/2002                                 DISCHARGE SUMMARY   DISCHARGE DIAGNOSES:  1. Dilantin toxicity.  2. Seizure disorder.  3. Hypertension.  4. Right incisor fracture status post teeth extraction.   PROCEDURES:  1. CT of the head.  2. Multiple teeth extractions with alveoloplasty.   CONSULTATIONS:  Dental, Charlynne Pander, D.D.S.   DISCHARGE MEDICATIONS:  1. Atenolol 50 mg 2 tablets daily.  2. HCTZ 25 mg daily.  3. Dilantin.  Do not take until directed by your doctor.  4. Augmentin 875 mg b.i.d. for 5 days.  5. Vicodin 5/500 mg 1 tablet every 6 hours p.r.n. pain.   FAMILY HISTORY:  The patient will return to the acute care clinic tomorrow,  June 21, for a Dilantin level.  She will then return to Dr. Angelyn Punt  office at [facility] on June 22 at 10:20 a.m. for lab work.  She will see  Dr. Audria Nine on June 29 at 10:40 a.m. for followup visit.  She is  instructed to call Dr. Luretha Murphy office for an appointment in one week to  remove stitches.   HISTORY OF PRESENT ILLNESS:  A 55 year old black female with seizure  disorder and hypertension presented to [facility] ER via EMS secondary to a  fall on day of admission.  She was  in the bathroom downstairs and felt  dizzy, fell to the floor, and hit her mouth.  She complained of a headache  and tinnitus bilaterally and vertigo over the past two weeks.  She was  diagnosed with an inner ear infection and given Meclizine at [facility].  The patient's Dilantin dose was increased during a hospitalization in May  2004 secondary to a TIA.  The patient's Dilantin level on admission was  56.3  with therapeutic range being 10 to 20, and patient was admitted for Dilantin  toxicity.   HOSPITAL COURSE:  1. DILANTIN TOXICITY:  The patient had multiple signs and symptoms of     Dilantin toxicity with nystagmus on exam as well as vertigo, tinnitus,     nausea, and vomiting.  Her blood pressure was stable on admission at     151/96, and she was placed on telemetry to monitor for hypotension with     Dilantin toxicity.  She remained stable and continued having some     dizziness with ambulation, but on day of discharge, she was ambulating     and walked without instability.  Her Dilantin level on day of discharge  was 29.4.  She will see Dr. Audria Nine for followup, and her Dilantin     level will need to be decreased.  She will be seen in the acute care     clinic tomorrow morning for lab work.  If her Dilantin level is     therapeutic, we will consider restarting her Dilantin tomorrow.   1. TEETH EXTRACTION:  On admission, the patient had a swollen upper lip     secondary to a fall.  A pantogram was obtained which showed a     nondisplaced fracture of the mid posterior right upper and lateral     incisor. Negative for mandible or maxillary fractures.  Dr. Kristin Bruins, of     dental medicine, was consulted and performed multiple teeth extractions     and alveoloplasty on June 18.  The patient tolerated the procedure well,     and her pain was controlled with Vicodin.  She was able to tolerate a     soft diet on discharge.  She will see Dr. Kristin Bruins in followup in one     week for removal of her sutures.   1. HYPERTENSION:  On admission, the patient's medications included Clonidine     patch weekly, HCTZ, and atenolol.  Secondary to history of medicine     noncompliance secondary to cost, the Clonidine patch was discontinued,     and she was continued on HCTZ at 25 mg daily as well as increased     atenolol to 100 mg daily.  He blood pressure was stable with good control      prior to discharge.   LABORATORY DATA:  Dilantin level on admission was 56.3, and discharge level  is 29.4.  On June 28, sodium 139, potassium 3.8, chloride 108, bicarb 27,  BUN 7, creatinine 0.5, glucose 136, calcium 8.8.  CBC on June 18 showed  white count 10.5, hemoglobin 12.7, hematocrit 38.4, platelets 197.  Urinalysis was within normal limits.   CT of the head was negative for acute bleed.                                                Billey Gosling, M.D.    AS/MEDQ  D:  11/13/2002  T:  11/14/2002  Job:  [id number]   cc:   Maurice March, M.D.  [address] [place]  [place] [address]  Fax: [phone]   Charlynne Pander, D.D.S.  [facility]  [address]  [place]  [place] [address]  Fax: [phone]    cc:   Maurice March, M.D.  [address] [place]  [place] [address]  Fax: [phone]   Charlynne Pander, D.D.S.  [facility]  [address]  [place]  [place] [address]  Fax: [phone]

## 2010-10-11 NOTE — Procedures (Signed)
NAME:  Sheri Oconnell, Sheri Oconnell                ACCOUNT NO.:  [id number]   MEDICAL RECORD NO.:  [id number]          PATIENT TYPE:  OUT   LOCATION:  SLEEP CENTER                 FACILITY:  [facility]   PHYSICIAN:  Clinton D. Maple Hudson, M.D. DATE OF BIRTH:  [birth year]   DATE OF STUDY:  12/10/2005                              NOCTURNAL POLYSOMNOGRAM   REFERRING PHYSICIAN:  Dr. Dow Adolph   INDICATIONS FOR STUDY:  Hypersomnia with sleep apnea.   EPWORTH SLEEPINESS SCORE:  11/24, BMI 36.  Weight 181 pounds.   HOME MEDICATIONS:  1.  Depakote.  2.  Glyburide/metformin.  3.  Atenolol.  4.  Clonidine.  5.  Triamterene/HCTZ.  6.  Zyrtec.  7.  Nasonex.   SLEEP ARCHITECTURE:  Total sleep time 435 minutes with sleep efficiency 94%.  Stage I was 5%, stage II 89%, stages III and IV were absent, REM 6% of total  sleep time.  Sleep latency 2 minutes, REM latency 353 minutes, awake after  sleep onset 24 minutes, arousal index 19 per hour.  No bedtime medication  was reported.   RESPIRATORY DATA:  Apnea/hypopnea index (AHI, RDI) 26.9 obstructive events  per hour, indicating moderate obstructive sleep apnea/hypopnea syndrome  before CPAP.  This included 6 obstructive apneas, 1 central apnea and 48  hypopneas before CPAP.  Events were not positional.  REM AHI absent.  CPAP  was titrated to 17 CWP, AHI 0.9 per hour.  A small Mirage Quattro mask was  used with heated humidifier.   OXYGEN DATA:  Moderately loud snoring with oxygen desaturation to a nadir of  88% before CPAP.  After CPAP control, saturation held 95-96% on room air.   CARDIAC DATA:  Normal sinus rhythm.   MOVEMENT/PARASOMNIA:  Occasional limb jerk with little effect on sleep.   IMPRESSION/RECOMMENDATIONS:  1.  Moderate obstructive sleep apnea/hypopnea syndrome, AHI 26.9 per hour      with non-positional events, moderately loud snoring and oxygen      desaturation to 88%.  2.  Successful CPAP titration to 17 CWP, AHI 0.9 per hour.  A small  Mirage      Quattro mask was used with heated humidity.      Clinton D. Maple Hudson, M.D.  Diplomate, Biomedical engineer of Sleep Medicine  Electronically Signed     CDY/MEDQ  D:  12/14/2005 10:40:32  T:  12/14/2005 23:37:57  Job:  [id number]

## 2010-10-11 NOTE — Discharge Summary (Signed)
NAMELAQUANDRA, CARRILLO                            ACCOUNT NO.:  [id number]   MEDICAL RECORD NO.:  [id number]                   PATIENT TYPE:  INP   LOCATION:  3019                                 FACILITY:  [facility]   PHYSICIAN:  Lazaro Arms, M.D.        DATE OF BIRTH:  05-23-1956   DATE OF ADMISSION:  09/24/2002  DATE OF DISCHARGE:  09/27/2002                                 DISCHARGE SUMMARY   DISCHARGE DIAGNOSES:  1. Hypertension.  2. History of seizure disorder.  3. Peripheral neuropathy.  4. History of transient ischemic attack.  5. Medication noncompliance secondary to lack of ability to pay for     medications.   BRIEF HISTORY OF PRESENT ILLNESS:  Mrs. Bumgardner was admitted on 09/25/02 with  right leg weakness and numbness for three days.  Her husband noticed she had  some right hand clumsiness and thought she had some garbled speech.  The  patient had run out of her blood pressure medicines the week prior, and  three days prior to admission she had decreased use of her right leg.  She  came to the emergency room on the evening of admission because her headache  had gotten much worse, and her husband thought her speech had been garbled.  She said that she was not writing well with her right hand.  Mostly she  complained that as she would use her arm it would become numb and painful  and she would have to drop what was in her hand.   She said her blood pressure was as high as 250 at home.  In the emergency  room, it was 179/85.  The patient did not have any medications at home, and  as it was unclear whether she was having progressive symptoms which would be  consistent with a stroke, she was admitted for observation and blood  pressure management, which could not be done as an outpatient.  The patient  was admitted.   She had several diagnostic tests.  She had Dopplers of her legs which were  negative.  She had carotid Dopplers bilaterally, and they were both within  normal limits.  She had MRI and MRA of the head which also showed no  evidence of stroke and was essentially normal.  The patient had a PT and OT  evaluation, and she did have fairly good strength, although the pain was  limiting her movement.  I examined her on the day of discharge.  She still  complained of some numbness to her leg.  Although her strength was good she  was limping secondary to the pins and needles feeling.  It did not seem to  get worse with straight leg raising.  I would easily reproduce the numbness  in her arm with the [facility] test, and she did indeed have positive  impingement sign.  Again, her strength was limited by the numbness in her  hand  when she would be asked to do anything with that arm.  The patient was  discharged home in stable condition on 09/27/02.  Blood pressure was 116/73,  pulse 68.  She was afebrile.  Exam was unremarkable, except for her  extremity and neurologic exam.  She did have some numbness of her legs, but  she had good plantar and dorsiflexion, and again she had some decreased grip  strength on the right, although again it was somewhat limited by pain.   DISCHARGE PLAN:  She is to go home.  She was given enough medicine to last  her for two weeks.  She is to go to [facility] as soon as possible, and  she does indeed have all the paperwork she needs in order to get set up  there.  She was to try an empiric trial of nonsteroidals to help with the  peripheral neuropathy, and she was told to call immediately if she had any  trouble with bowel or bladder incontinence or any increasing back or neck  pain.  She is to get PT as an outpatient, which will help hopefully with her  peripheral neuropathy.  If it were to get worse, then when she follows up  with [facility] they might at that point consider an MRI for further  evaluation of her lower back and neck.   DISCHARGE MEDICATIONS:  1. Dilantin 400 mg p.o. b.i.d.  2. Clonidine patch 0.2 mg  transdermal change q week.  3. Atenolol 50 mg po daily.  4. Hydrochlorothiazide 25 mg p.o. daily.  5. Take two Aleve twice a day for the next week with food.                                               Lazaro Arms, M.D.    AMC/MEDQ  D:  09/27/2002  T:  09/29/2002  Job:  [id number]   cc:   [facility]

## 2010-10-11 NOTE — Consult Note (Signed)
NAME:  Sheri Oconnell, Sheri Oconnell                          ACCOUNT NO.:  [id number]   MEDICAL RECORD NO.:  [id number]                   PATIENT TYPE:  OUT   LOCATION:  GYN                                  FACILITY:  [facility]   PHYSICIAN:  De Blanch, M.D.         DATE OF BIRTH:  [birth year]   DATE OF CONSULTATION:  DATE OF DISCHARGE:                                   CONSULTATION   A 55 year old African American female seen in consultation at the request of  Dr. Lesly Dukes of the [facility] teaching service regarding  management of a painful cystic left adnexal mass.   The patient's past gynecologic history dates back to undergoing a total  abdominal hysterectomy and bilateral salpingectomy in 1984.  Apparently, her  ovaries were normal at that time. In October 2003, the patient had a left-  sided 6 x 3.9 x 4.1-cm, well defined, low density mass along the left pelvic  sidewall. She was operated on at the [facility] by the gynecology service initially undergoing laparoscopy with  conversion to a laparotomy and (according to the operative note) there were  significant intra-abdominal adhesions. The patient underwent a right  salpingo-oophorectomy with finding of a corpus luteum. The surgeons were  never able to visualize the left ovary nor to identify the mass.  The  operative note is not clear as to the extent of the dissection and palpation  is discussed. Subsequently, the patient has undergone followup imaging and  CT scan in January 2005 which is compared to the June 2003 scan shows a 9.6  cm cystic left adnexal mass just anterior to the left iliac vessels and  extending to the level of the bifurcation of the aorta. There is no  adenopathy and no free fluid. Ultrasound of the same mass shows the presence  of mural nodularity.  CA-125 value is 14.9.  The patient is very  uncomfortable and in a considerable amount of pain in the left lower  quadrant. She has had a considerable amount of constipation associated with  nausea and vomiting making it difficult for her to take her required  medications for seizure disorder and hypertension.   PAST MEDICAL HISTORY:  Medical illnesses include hypertension, seizure  disorder.   PAST SURGICAL HISTORY:  Open cholecystectomy, total abdominal hysterectomy  and bilateral salpingectomy, appendectomy, and right oophorectomy.   DRUG ALLERGIES:  None.   CURRENT MEDICATIONS:  1. Atenolol.  2. Hydrochlorothiazide.  3. Aspirin and Nexium every morning.  4. Depakote and clonidine at bedtime (the patient has recently been on oral     hormone replacement therapy, but this was discontinued by her primary     care physician).   REVIEW OF SYSTEMS:  Otherwise negative.   SOCIAL HISTORY:  The patient is married. She is unemployed. She has one  living child who is 32 years of age who lives with the patient and her  husband.   PHYSICAL EXAMINATION:  VITAL SIGNS: Height 4 feet 11 inches, weight 176  pounds, blood pressure 170/100, pulse 100, respirations 20.  GENERAL: The patient is a healthy black female in moderate distress from  left lower quadrant pain.  HEENT: Negative.  NECK: Supple without thyromegaly. There is no supraclavicular or inguinal  adenopathy.  ABDOMEN: Soft with some tenderness to palpation in the left lower quadrant.  There is no rebound. There is no distention, ascites, or masses palpable.  She has well-healed midline, Pfannenstiel, and right subcostal incisions.  PELVIC EXAM: EGBUS, vagina, bladder, and urethra normal. Cervix and uterus  are surgically absent. Bimanual examination reveals fullness and tenderness  in the left adnexa. A distinct mass cannot be easily palpated secondary the  patient's pain. Rectovaginal exam confirms.   LABORATORY DATA:  The patient's records and scans are reviewed in detail  along with operative notes from her surgery at [facility].    IMPRESSION:  Complex left adnexal mass which likely is benign given the fact  that it has been present since 2003, although it has increased in size and  is now symptomatic. I suspect this is the left ovary which is probably  buried behind adhesions of the left colon and sigmoid colon on the pelvic  sidewall.  Fortunately, there is no evidence of hydronephrosis.   PLAN:  I recommend that the patient undergo exploratory laparotomy and  resection of the mass with plan to take a retroperitoneal approach in order  to protect the blood vessels and ureter.  The patient is aware of the risks  of surgery including hemorrhage, infection, injury to adjacent viscera  including the colon, ureter, and blood vessels as well as potential for  neurologic injury. The risk of residual ovary syndrome is also discussed,  although we will make every attempt to resect all ovarian tissue and the  cyst capsule.   We will plan surgery in coordination with Dr. Elsie Lincoln next Tuesday.  All the patient's questions are answered as are the questions of her  husband.                                               De Blanch, M.D.    DC/MEDQ  D:  06/28/2003  T:  06/28/2003  Job:  [id number]   cc:   Lesly Dukes, M.D.   Dr. Marlynn Perking  [facility],  Dept of OB-GYN  [address]  [place], [place] [address]   Telford Nab, R.N.  [phone] [address]  [place], [place] [address]

## 2010-10-11 NOTE — Consult Note (Signed)
NAME:  Sheri Oconnell, Sheri Oconnell                        ACCOUNT NO.:  [id number]   MEDICAL RECORD NO.:  [id number]                   PATIENT TYPE:  INP   LOCATION:  3728                                 FACILITY:  [facility]   PHYSICIAN:  Charlynne Pander, D.D.S.          DATE OF BIRTH:  [birth year]   DATE OF CONSULTATION:  11/10/2002  DATE OF DISCHARGE:                                   CONSULTATION   HISTORY:  Sheri Oconnell is a 55 year old female referred by Dr. Harriett Sine Phifer  for a dental consultant, a patient with recent fall secondary to possible  Dilantin toxicity.  The patient suffered dental trauma at that time.  Dental  consultation was requested to evaluate dental trauma and rule out dental  infection which may affect the patient's systemic health.   PAST MEDICAL HISTORY:  1. Seizure disorder with history of recent Dilantin toxicity.  2. Hypertension.  3. 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  4. Dental trauma secondary to a fall with right incisor fracture (#7) as     well as tooth #8.  5. Status post hysterectomy in October 2003 secondary to fibroids>   ALLERGIES AND ADVERSE DRUG REACTIONS:  None known.   MEDICATIONS:  1. Protonix 40 mg daily.  2. Catapres 0.2 mg per 24 hours via patch every 7 days.  3. Tenormin 50 mg daily.  4. Hydrochlorothiazide 25 mg daily.  5. Vicodin 5/500 one by mouth every 6 hours as needed.  6. Senokot 2 at bedtime.   SOCIAL HISTORY:  The patient is married.  The patient has a history of  smoking one pack per week for the past 25 years.  The patient denies use of  alcohol or IV drugs.   FAMILY HISTORY:  Mother died at the age of 65 via murder.  Father is 27  years old with a history of prostate cancer.  Family history is also  positive for hypertension, diabetes mellitus, and coronary artery disease.   FUNCTIONAL ASSESSMENT:  The patient remains independent for basic ADLs at  this time.   REVIEW OF SYSTEMS:  This is reviewed from chart and health  assessment form  this admission.   DENTAL HISTORY:   CHIEF COMPLAINT:  Dental consultation was requested to evaluate dental  trauma to the maxillary anterior teeth.   HISTORY OF PRESENT ILLNESS:  This is a patient with recent fall, possibly  secondary to a Dilantin toxicity.  The patient was admitted, and the patient  was noted to have trauma to tooth numbers 7 and 8 at that time.  Dental  consultation was requested to evaluate the dental trauma and to rule out  dental infection which may affect the patient's systemic health.   The patient is currently complaining of tooth pain associated with tooth  numbers 7 and 8.  The patient has been using Tylenol with good relief and is  requesting additional pain medicine with the use of  Vicodin 5/500 mg as  indicated.  The patient is currently experiencing 7/10 intensity pain.   The patient has not seen the dentist for a long time.  The patient has been  unable to afford this dental treatment by her report.   DENTAL EXAMINATION:  GENERAL:  The patient is well developed, well nourished  African-American female in no acute distress.  VITAL SIGNS:  Blood pressure 130/70, pulse equal to 80, temperature 98.7,  respirations 20.  HEAD AND NECK:  There is no palpable lymphadenopathy.  There are no acute  TMJ symptoms.  INTRAORAL:  The patient has normal saliva.  There is no evidence of abscess  formation. The patient does have trauma associated with the upper and lower  lips consistent with previous fall.  PERIODONTAL:  A patient with chronic periodontitis with significant plaque  and calculus accumulations and evidence of moderate to severe bone loss.  DENTITION:  The patient is missing multiple teeth.  Tooth #7 appears to be  fracture tangentially below the gum line.  This tooth will need to be  extracted.  The maxillary anterior incisor is also fractured with no obvious  pulpal exposure.  This tooth also appears to have some dentinal  sensitivity.  DENTAL CARIES:  The patient needs to follow up with a general dentist for a  full series of dental radiographs and dental restorations as indicated.  ENDODONTIC:  The patient is currently experiencing a few pulpitis symptoms  secondary to trauma from a fall.  There is no obvious exposure of the  maxillary central incisor, incisor nerve, and most likely will suffice with  a resin bandage.  Tooth #7 is fractured below the gum line and will need to  be extracted at this time.  The patient also has had previous root canal  therapy associated with tooth number 30, but there is persistent periapical  radiolucency.  The patient agrees to extraction of this tooth as well.  CROWN AND BRIDGE:  There are no crown and bridge restorations.  PROSTHODONTIC:  The patient denies the presence of partial dentures.  OCCLUSION:  A patient with poor occlusal scheme secondary to multiple  missing teeth, supereruption and drifting of the unopposed teeth into the  edentulous areas, and lack of replacement of missing teeth with clinically  acceptable restoration.   RADIOGRAPHIC INTERPRETATION:  A panoramic x-ray was taken on 11/09/2002.   There are multiple missing teeth.  There is a fractured tooth #7 mid root.  There is chronic apical periodontitis associated with tooth #30 at this  time.  There are several almalgam restorations.  There is moderate  horizontal and vertical bone loss.  Would suggest a full series of  periapical radiographs to evaluate for further pathology and other dental  caries.   ASSESSMENT:  1. History of dental trauma with mid root fracture of tooth #7 and central     incisor fracture with no nerve exposure.  The patient will require     extraction of tooth #7 and a resin bandage of the central incisor.  2. 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  3. Multiple missing teeth.  4. Chronic periodontitis with bone loss. 5. Significant plaque and calculus  accumulations.  6. Area of gingival recession.  7. Tooth mobility, especially tooth #7 at this time.  8. History of oral neglect.  9. Need for followup comprehensive dental care and full series of dental     radiographs.    PLAN AND RECOMMENDATIONS:  1. I discussed  the risks, benefits, and complications of various treatment     options with the patient in relation to her medical and dental     conditions.  We discussed the various treatment options to include no     treatment, periodontal therapy, extraction of indicated teeth,     alveoloplasty, root canal therapy, crown and bridge therapy, implant     therapy, and the replacement of missing teeth as indicated.  We also     discussed followup with a general dentist for full series of dental     radiographs, comprehensive examination, and dental treatment plan as     indicated.  The patient currently wishes to proceed with extraction of     indicated teeth with alveoloplasty, resin bandage of the central incisor,     and gross debridement of her remaining teeth as time and space permits in     the operating room.  This case will be scheduled for Friday at 7:30 in     the morning pending time and space in the operating room.  2. Discussion of findings with Dr. Harriett Sine Phifer to ensure the medical     ability/stability of the patient to undergo dental procedures as planned.     If acceptable, will proceed with operating room procedure on Friday     morning.  3. Will assist in coordination and referral to a general dentist for     subsequent comprehensive dental evaluation with dental x-rays as     indicated.                                               Charlynne Pander, D.D.S.    RFK/MEDQ  D:  11/10/2002  T:  11/11/2002  Job:  [id number]   cc:   Alvester Morin, M.D.  [address]  [place]  [place] [address]  Fax: [phone]   Dr. Chrisandra Carota [phone]

## 2010-10-11 NOTE — Discharge Summary (Signed)
NAME:  Sheri Oconnell, FREDLUND                          ACCOUNT NO.:  [id number]   MEDICAL RECORD NO.:  [id number]                   PATIENT TYPE:  INP   LOCATION:  0479                                 FACILITY:  [facility]   PHYSICIAN:  Lesly Dukes, M.D.              DATE OF BIRTH:  09-08-1955   DATE OF ADMISSION:  07/04/2003  DATE OF DISCHARGE:  07/07/2003                                 DISCHARGE SUMMARY   DISCHARGE DIAGNOSES:  1. Benign left adnexal mass.  2. Seizure disorder.  3. Hypertension.   PROCEDURES:  Exploratory laparotomy, lysis of adhesions, and left salpingo-  oophorectomy.   CONSULTATIONS:  Dr. De Blanch of gynecology.   DISCHARGE MEDICATIONS:  1. Clonidine 0.2 mg p.o. q.h.s.  2. Depakote 500 mg p.o. q.h.s.  3. Atenolol 50 mg p.o. daily.  4. Famotidine 20 mg p.o. b.i.d.  5. Percocet 5/325, 1-2 tablets p.o. q.4-6h. p.r.n.   HISTORY OF PRESENT ILLNESS:  A 55 year old African-American female with  ongoing abdominal pain with large benign-appearing cystic mass on the left  side.  The patient had several laparotomies in the past including abdominal  hysterectomy and a right salpingo-oophorectomy.  The patient underwent an  exploratory laparotomy, lysis of adhesions, and left salpingo-oophorectomy  and removal of adnexal mass.  The operation was uncomplicated, and her  postoperative course was benign.  The patient had no fevers, passed flatus  right away, had a bowel movement, and tolerated p.o.'s without any problem.  The patient had no seizures during this time, and her blood pressure was  well controlled.   DISCHARGE INSTRUCTIONS GIVEN TO PATIENT:  1. Return to clinic Tuesday for staple removal at [facility].  2. Take temperatures twice a day.  If fever occurs or severe abdominal pain     occurs, the patient to come to the MAU.  3. Resume all pre-hospital medications.                                               Lesly Dukes, M.D.    Sheri Oconnell  D:  07/07/2003  T:  07/07/2003  Job:  [id number]

## 2010-10-11 NOTE — Op Note (Signed)
NAME:  Sheri Oconnell, Sheri Oconnell                        ACCOUNT NO.:  [id number]   MEDICAL RECORD NO.:  [id number]                   PATIENT TYPE:  INP   LOCATION:  3728                                 FACILITY:  [facility]   PHYSICIAN:  Charlynne Pander, D.D.S.          DATE OF BIRTH:  04-16-1956   DATE OF PROCEDURE:  11/11/2002  DATE OF DISCHARGE:  11/13/2002                                 OPERATIVE REPORT   PREOPERATIVE DIAGNOSIS:  1. Seizure disorder with a history of Dilantin toxicity.  2. Dental trauma.  3. Chronic apical periodontitis.  4. Chronic periodontitis with tooth mobility.  5. Significant accretions.   POSTOPERATIVE DIAGNOSIS:  1. Seizure disorder with a history of Dilantin toxicity.  2. Dental trauma.  3. Chronic apical periodontitis.  4. Chronic periodontitis with tooth mobility.  5. Significant accretions.   OPERATION PERFORMED:  1. Dental examination.  2. Extraction of teeth numbers 7, 8, 9, 10 and 30.  3. Two quadrants of alveoloplasty.  4. Gross debridement of the remaining dentition.   SURGEON:  Charlynne Pander, D.D.S.   ASSISTANT:  Elliot Dally, Sales executive.   ANESTHESIA:  1. Monitored anesthesia care per the anesthesia team.  2. Local anesthesia with total utilization of four carpules each containing     36 mg of Xylocaine with 0.018 mg of epinephrine.   MEDICATIONS:  Ampicillin 2.0 gm IV prior to invasive dental procedures.   SPECIMENS:  There were five teeth which were discarded.   DRAINS/CULTURES:  None.   COMPLICATIONS:  None.   FLUIDS REPLACED:  lactated Ringer's solution.   ESTIMATED BLOOD LOSS:  Less than .   INDICATIONS FOR PROCEDURE:  The patient had a history of recent dental  trauma from a fall.  The patient was examined and treatment planned for  extraction of the indicated teeth with alveoloplasty along with the gross  debridement of the remaining dentition.  This treatment plan was formulated  to decrease the  risks and complications associated with  dental infection  from affecting the patient's systemic health.   OPERATIVE FINDINGS:  The patient was examined in the operating room #10.  The maxillary anterior area was examined.  At this point in time, it was  determined that tooth  #7 was fractured, mid root.  Teeth numbers 8 and 9  had 2+ mobility and tooth #10 was fractured mid root as well.  The patient  was also noted to be affected by chronic periodontitis, chronic apical  periodontitis and presence of significant accretions.   DESCRIPTION OF PROCEDURE:  The patient was brought to the main operating  room #10.  The patient was placed in the supine position on the operating  room table. Monitored anesthesia care was induced per the anesthesia team.  The patient was then prepped and draped in the usual manner for a dental  medicine procedure.  The oral cavity was thoroughly examined with findings  as  noted above.  The patient was then ready for the dental medicine  procedure as follows.   Local anesthesia was administered sequentially over the hour long procedure.  The maxillary anterior quadrant was first approached.  Anesthesia was  delivered as previously described.  A 15 blade incision was made from the  mesial of #6 to the mesial of #11.  A surgical flap was then reflected.  Teeth numbers 8 and 9 were removed with the 150 forceps without  complications.  The coronal segments of tooth numbers 7 and 10 were then  removed with a 150 forceps.  This left the remaining roots.  A surgical  handpiece and bur was utilized to remove the appropriate amounts of facial  and interseptal bone with a surgical handpiece and bur and copious amounts  of sterile saline.  These root segments were further subluxated and removed  with a 150 forceps without complications.  At this point the surgical site  was irrigated with copious amounts of sterile saline.  Alveoloplasty was  performed as indicated with  rongeurs and bone file.  The tissues were  approximated with trimmed appropriately.  The surgical site was then again  irrigated with copious amounts of sterile saline times two.  The surgical  site was then closed from the medial of #6 to the mesial of #11 utilizing 3-  0 chromic gut suture in a continuous interrupted suture technique times one.  At this point a soft tissue defect was noted near the frenum. This was  closed with three separate 3-0 chromic gut interrupted sutures.   The mandibular right quadrant was then approached.  Anesthesia was delivered  as previously described.  A 15 blade incision was made from the mesial of  #31 to the distal of #29.  The surgical flap was reflected.  Tooth #30 was  subluxated with a series of straight elevators.  A 23 forceps was utilized  to section the tooth into two pieces.  The coronal segment was removed at  this time.  The three remaining root segments were then elevated out after  an appropriate amount of removal of buccal and interseptal bone.  These were  removed without complications.  The surgical site was then curetted and  compressed appropriately.  Alveoloplasty was then performed utilizing  rongeurs and bone file.  The tissues were approximated and trimmed  appropriately.  The surgical site was then irrigated with copious amounts of  sterile saline times two.  The surgical site was then closed utilizing 3-0  chromic gut suture in continuous interrupted suture technique from the  medial of #31 through the distal of #28.  At this point the remaining teeth  were then approached.  A Kavo Sonic scaler was utilized to remove  significant accretions. A series of hand curettes was then utilized to  remove further accretions.  The Kavo Sonic scaler was then again utilized to  remove further accretions.  At this point the dental medicine procedure was  deemed to be complete.  The entire mouth was irrigated with copious amounts of sterile  saline.  The patient was examined for complications, seeing none,  the dental medicine procedure was deemed to be complete.  The patient was  then handed over to the anesthesia team for final disposition.  After  appropriate amount of time, the patient was taken to the post anesthesia  care unit with stable vital signs and good oxygenation level.  All counts  were correct for the dental medicine procedure.  The patient will be placed  on Indocin IV antibiotic therapy and then discharged with oral Augmentin or  amoxicillin therapy as indicated.  Appropriate pain medication will also be  prescribed.                                                Charlynne Pander, D.D.S.    RFK/MEDQ  D:  11/11/2002  T:  11/14/2002  Job:  [id number]   cc:   Alvester Morin, M.D.  [address]  [place]  [place] [address]  Fax: [phone]

## 2010-10-11 NOTE — Op Note (Signed)
NAME:  Sheri Oconnell, Sheri Oconnell                          ACCOUNT NO.:  [id number]   MEDICAL RECORD NO.:  [id number]                   PATIENT TYPE:  INP   LOCATION:  0479                                 FACILITY:  [facility]   PHYSICIAN:  De Blanch, M.D.         DATE OF BIRTH:  [birth year]   DATE OF PROCEDURE:  07/04/2003  DATE OF DISCHARGE:                                 OPERATIVE REPORT   PREOPERATIVE DIAGNOSIS:  Complex left pelvic mass.   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   PROCEDURES:  1. Exploratory laparotomy.  2. Extensive lysis of adhesions.  3. Ureterolysis.  4. Left salpingo-oophorectomy.   SURGEON:  De Blanch, M.D.   ASSISTANTS:  Lesly Dukes, M.D., Telford Nab, R.N.   ANESTHESIA:  General with orotracheal tube.   ESTIMATED BLOOD LOSS:  350 mL.   SURGICAL FINDINGS:  At the time of exploratory laparotomy, the patient had  dense adhesions from prior surgical procedures necessitating approximately  45 minutes of operating time in order to free adhesions in order to expose  the pelvis and the adnexal mass.  The omentum was densely adherent to the  anterior abdominal wall and pelvic floor and bladder flap.  The sigmoid  colon was densely adherent to a mass arising from what appeared to be the  left ovary, which was densely adherent to the pelvic sidewall peritoneum.  There was retroperitoneal fibrosis necessitating mobilization of the ureter  (ureterolysis) throughout its course in order to protect it.   DESCRIPTION OF PROCEDURE:  The patient was brought to the operating room and  after satisfactory attainment of general anesthesia was placed in the  modified lithotomy position in Ray stirrups.  The anterior abdominal wall,  perineum, and vagina were prepped with Betadine, a Foley catheter was  inserted, and the patient  was draped.  The abdomen was entered through a low  midline incision, excising her old scar.  Peritoneal washings were obtained.  We then undertook extensive lysis of adhesions.  First of all, the omentum  was densely adherent to the anterior abdominal wall.  This was freed with  sharp and blunt dissection and cautery for hemostasis.  Once the omentum was  totally mobilized from the abdominal wall, we traced it down to the pelvis,  where it was further freed up from its dense adherence to the sigmoid colon  and bladder flap.  At this juncture a Bookwalter retractor was positioned  and the small bowel was packed out of the pelvis.  Attention was turned to  the left pelvic sidewall.  A peritoneal incision was made along the  pericolic gutter and left pelvic sidewall.  The round ligament was divided.  The retroperitoneal space was then opened, identifying the external iliac  artery and vein, internal iliac artery, common iliac artery, and ureter.  The ovarian vessels  were skeletonized, clamped, cut, free-tied, and suture  ligated.  Holding the distal vascular pedicle, the ovarian cyst was  identified.  It was densely adherent to the mesentery of the sigmoid colon.  Extensive dissection was required to remove this cyst.  In the course of the  removal a leak was developed in the cyst, draining approximately 300 mL of  clear fluid.  The dissection of the cyst wall was carefully undertaken in  order to not leave any cyst wall behind.  In order to do this, the fat in  the sigmoid mesentery was entered.  On several occasions sigmoid mesenteric  vessels were opened.  These were suture ligated and clipped to achieve  hemostasis.  The ureter was then identified and ureterolysis was  necessitated in order to free the ureter from the retroperitoneal fibrosis  and to protect it from injury.  After having accomplished ureterolysis, the  ureter was pulled laterally and further dissection was carried out,  excising  the peritoneum of the left pelvic sidewall in continuity with the ovarian  cyst.  Similar dissection was carried out into the paravesical space with  care taken to avoid injury to the superior vesical artery.  Finally the  dense adherence to the vaginal angle was encountered.  This was crossclamped  with an Anderson clamp and divided.  The cyst and fallopian tube were handed  off the operative field for frozen section, which returned as benign. The  pedicle was suture ligated.  Hemostasis was then achieved in the pelvis  using cautery and Hemoclips.  The opposite site of the pelvis was inspected  and found to be hemostatic.  Additional bleeding was noted from the omentum.  Suture ligatures were taken to achieve hemostasis.   The pelvis and abdomen were irrigated with copious amounts of warm saline.  The packs and retractors were removed.  The anterior abdominal wall was  closed in layers, the first being a running mass closure using #1 PDS.  The  subcutaneous tissue was irrigated and reapproximated using 3-0 Vicryl.  Hemostasis had been achieved with cautery.  Skin staples were applied, a  dressing was applied.  The patient was awakened from anesthesia and taken to  the recovery room in satisfactory condition.  Sponge, needle, and instrument  counts correct x2.                                               De Blanch, M.D.    DC/MEDQ  D:  07/04/2003  T:  07/05/2003  Job:  [id number]   cc:   Lesly Dukes, M.D.   Telford Nab, R.N.  [address]  [place], [place] [address]

## 2015-04-23 DIAGNOSIS — K224 Dyskinesia of esophagus: Secondary | ICD-10-CM | POA: Diagnosis not present

## 2015-04-23 DIAGNOSIS — R1310 Dysphagia, unspecified: Secondary | ICD-10-CM | POA: Diagnosis not present

## 2015-04-23 DIAGNOSIS — R10817 Generalized abdominal tenderness: Secondary | ICD-10-CM | POA: Diagnosis not present

## 2015-04-23 DIAGNOSIS — E040 Nontoxic diffuse goiter: Secondary | ICD-10-CM | POA: Diagnosis not present

## 2015-04-23 DIAGNOSIS — K2270 Barrett's esophagus without dysplasia: Secondary | ICD-10-CM | POA: Diagnosis not present

## 2015-04-24 DIAGNOSIS — R58 Hemorrhage, not elsewhere classified: Secondary | ICD-10-CM | POA: Diagnosis not present

## 2015-04-24 DIAGNOSIS — R040 Epistaxis: Secondary | ICD-10-CM | POA: Diagnosis not present

## 2015-04-24 DIAGNOSIS — Z7902 Long term (current) use of antithrombotics/antiplatelets: Secondary | ICD-10-CM | POA: Diagnosis not present

## 2015-04-27 DIAGNOSIS — I10 Essential (primary) hypertension: Secondary | ICD-10-CM | POA: Diagnosis not present

## 2015-05-03 DIAGNOSIS — K219 Gastro-esophageal reflux disease without esophagitis: Secondary | ICD-10-CM | POA: Diagnosis not present

## 2015-05-03 DIAGNOSIS — Z720 Tobacco use: Secondary | ICD-10-CM | POA: Diagnosis not present

## 2015-05-03 DIAGNOSIS — M545 Low back pain: Secondary | ICD-10-CM | POA: Diagnosis not present

## 2015-05-03 DIAGNOSIS — E785 Hyperlipidemia, unspecified: Secondary | ICD-10-CM | POA: Diagnosis not present

## 2015-05-03 DIAGNOSIS — E119 Type 2 diabetes mellitus without complications: Secondary | ICD-10-CM | POA: Diagnosis not present

## 2015-05-10 DIAGNOSIS — R809 Proteinuria, unspecified: Secondary | ICD-10-CM | POA: Diagnosis not present

## 2015-05-10 DIAGNOSIS — M5440 Lumbago with sciatica, unspecified side: Secondary | ICD-10-CM | POA: Diagnosis not present

## 2015-05-10 DIAGNOSIS — M5416 Radiculopathy, lumbar region: Secondary | ICD-10-CM | POA: Diagnosis not present

## 2015-05-10 DIAGNOSIS — M5136 Other intervertebral disc degeneration, lumbar region: Secondary | ICD-10-CM | POA: Diagnosis not present

## 2015-05-10 DIAGNOSIS — E1129 Type 2 diabetes mellitus with other diabetic kidney complication: Secondary | ICD-10-CM | POA: Diagnosis not present

## 2015-05-23 DIAGNOSIS — N63 Unspecified lump in breast: Secondary | ICD-10-CM | POA: Diagnosis not present

## 2015-05-31 DIAGNOSIS — N63 Unspecified lump in breast: Secondary | ICD-10-CM | POA: Diagnosis not present

## 2015-06-06 DIAGNOSIS — N644 Mastodynia: Secondary | ICD-10-CM | POA: Diagnosis not present

## 2015-06-14 DIAGNOSIS — M5416 Radiculopathy, lumbar region: Secondary | ICD-10-CM | POA: Diagnosis not present

## 2015-06-14 DIAGNOSIS — E1129 Type 2 diabetes mellitus with other diabetic kidney complication: Secondary | ICD-10-CM | POA: Diagnosis not present

## 2015-06-14 DIAGNOSIS — M5440 Lumbago with sciatica, unspecified side: Secondary | ICD-10-CM | POA: Diagnosis not present

## 2015-06-14 DIAGNOSIS — M5136 Other intervertebral disc degeneration, lumbar region: Secondary | ICD-10-CM | POA: Diagnosis not present

## 2015-06-14 DIAGNOSIS — I10 Essential (primary) hypertension: Secondary | ICD-10-CM | POA: Diagnosis not present

## 2015-07-13 DIAGNOSIS — K219 Gastro-esophageal reflux disease without esophagitis: Secondary | ICD-10-CM | POA: Diagnosis not present

## 2015-07-13 DIAGNOSIS — E119 Type 2 diabetes mellitus without complications: Secondary | ICD-10-CM | POA: Diagnosis not present

## 2015-07-13 DIAGNOSIS — M545 Low back pain: Secondary | ICD-10-CM | POA: Diagnosis not present

## 2015-07-13 DIAGNOSIS — Z720 Tobacco use: Secondary | ICD-10-CM | POA: Diagnosis not present

## 2015-07-13 DIAGNOSIS — E785 Hyperlipidemia, unspecified: Secondary | ICD-10-CM | POA: Diagnosis not present

## 2015-08-03 DIAGNOSIS — E119 Type 2 diabetes mellitus without complications: Secondary | ICD-10-CM | POA: Diagnosis not present

## 2015-08-03 DIAGNOSIS — E785 Hyperlipidemia, unspecified: Secondary | ICD-10-CM | POA: Diagnosis not present

## 2015-08-03 DIAGNOSIS — M545 Low back pain: Secondary | ICD-10-CM | POA: Diagnosis not present

## 2015-08-03 DIAGNOSIS — I10 Essential (primary) hypertension: Secondary | ICD-10-CM | POA: Diagnosis not present

## 2015-08-03 DIAGNOSIS — Z720 Tobacco use: Secondary | ICD-10-CM | POA: Diagnosis not present

## 2015-09-14 DIAGNOSIS — Z7689 Persons encountering health services in other specified circumstances: Secondary | ICD-10-CM | POA: Diagnosis not present

## 2015-09-14 DIAGNOSIS — E119 Type 2 diabetes mellitus without complications: Secondary | ICD-10-CM | POA: Diagnosis not present

## 2015-09-14 DIAGNOSIS — M545 Low back pain: Secondary | ICD-10-CM | POA: Diagnosis not present

## 2015-09-14 DIAGNOSIS — Z0000 Encounter for general adult medical examination without abnormal findings: Secondary | ICD-10-CM | POA: Diagnosis not present

## 2015-09-14 DIAGNOSIS — Z01118 Encounter for examination of ears and hearing with other abnormal findings: Secondary | ICD-10-CM | POA: Diagnosis not present

## 2015-10-01 DIAGNOSIS — R1013 Epigastric pain: Secondary | ICD-10-CM | POA: Diagnosis not present

## 2015-10-01 DIAGNOSIS — Z720 Tobacco use: Secondary | ICD-10-CM | POA: Diagnosis not present

## 2015-10-01 DIAGNOSIS — E785 Hyperlipidemia, unspecified: Secondary | ICD-10-CM | POA: Diagnosis not present

## 2015-10-01 DIAGNOSIS — E119 Type 2 diabetes mellitus without complications: Secondary | ICD-10-CM | POA: Diagnosis not present

## 2015-10-01 DIAGNOSIS — I10 Essential (primary) hypertension: Secondary | ICD-10-CM | POA: Diagnosis not present

## 2015-10-09 DIAGNOSIS — M2041 Other hammer toe(s) (acquired), right foot: Secondary | ICD-10-CM | POA: Diagnosis not present

## 2015-10-09 DIAGNOSIS — E119 Type 2 diabetes mellitus without complications: Secondary | ICD-10-CM | POA: Diagnosis not present

## 2015-10-09 DIAGNOSIS — M2042 Other hammer toe(s) (acquired), left foot: Secondary | ICD-10-CM | POA: Diagnosis not present

## 2015-10-09 DIAGNOSIS — M2011 Hallux valgus (acquired), right foot: Secondary | ICD-10-CM | POA: Diagnosis not present

## 2015-10-09 DIAGNOSIS — M2012 Hallux valgus (acquired), left foot: Secondary | ICD-10-CM | POA: Diagnosis not present

## 2015-10-12 DIAGNOSIS — E119 Type 2 diabetes mellitus without complications: Secondary | ICD-10-CM | POA: Diagnosis not present

## 2015-10-12 DIAGNOSIS — M545 Low back pain: Secondary | ICD-10-CM | POA: Diagnosis not present

## 2015-10-12 DIAGNOSIS — Z720 Tobacco use: Secondary | ICD-10-CM | POA: Diagnosis not present

## 2015-10-12 DIAGNOSIS — E785 Hyperlipidemia, unspecified: Secondary | ICD-10-CM | POA: Diagnosis not present

## 2015-10-12 DIAGNOSIS — I10 Essential (primary) hypertension: Secondary | ICD-10-CM | POA: Diagnosis not present

## 2015-10-18 DIAGNOSIS — E785 Hyperlipidemia, unspecified: Secondary | ICD-10-CM | POA: Diagnosis not present

## 2015-10-18 DIAGNOSIS — I10 Essential (primary) hypertension: Secondary | ICD-10-CM | POA: Diagnosis not present

## 2015-10-18 DIAGNOSIS — M545 Low back pain: Secondary | ICD-10-CM | POA: Diagnosis not present

## 2015-10-18 DIAGNOSIS — E119 Type 2 diabetes mellitus without complications: Secondary | ICD-10-CM | POA: Diagnosis not present

## 2015-10-18 DIAGNOSIS — Z720 Tobacco use: Secondary | ICD-10-CM | POA: Diagnosis not present

## 2015-10-30 DIAGNOSIS — M2040 Other hammer toe(s) (acquired), unspecified foot: Secondary | ICD-10-CM | POA: Diagnosis not present

## 2015-10-30 DIAGNOSIS — M2010 Hallux valgus (acquired), unspecified foot: Secondary | ICD-10-CM | POA: Diagnosis not present

## 2015-10-30 DIAGNOSIS — E119 Type 2 diabetes mellitus without complications: Secondary | ICD-10-CM | POA: Diagnosis not present

## 2015-11-02 DIAGNOSIS — K1120 Sialoadenitis, unspecified: Secondary | ICD-10-CM | POA: Diagnosis not present

## 2015-11-16 DIAGNOSIS — M5136 Other intervertebral disc degeneration, lumbar region: Secondary | ICD-10-CM | POA: Diagnosis not present

## 2015-11-16 DIAGNOSIS — I10 Essential (primary) hypertension: Secondary | ICD-10-CM | POA: Diagnosis not present

## 2015-11-16 DIAGNOSIS — E1129 Type 2 diabetes mellitus with other diabetic kidney complication: Secondary | ICD-10-CM | POA: Diagnosis not present

## 2015-11-16 DIAGNOSIS — M5416 Radiculopathy, lumbar region: Secondary | ICD-10-CM | POA: Diagnosis not present

## 2015-11-16 DIAGNOSIS — M5440 Lumbago with sciatica, unspecified side: Secondary | ICD-10-CM | POA: Diagnosis not present

## 2015-11-23 DIAGNOSIS — M545 Low back pain: Secondary | ICD-10-CM | POA: Diagnosis not present

## 2015-11-23 DIAGNOSIS — M5416 Radiculopathy, lumbar region: Secondary | ICD-10-CM | POA: Diagnosis not present

## 2015-11-23 DIAGNOSIS — M5136 Other intervertebral disc degeneration, lumbar region: Secondary | ICD-10-CM | POA: Diagnosis not present

## 2015-11-23 DIAGNOSIS — M5440 Lumbago with sciatica, unspecified side: Secondary | ICD-10-CM | POA: Diagnosis not present

## 2015-11-23 DIAGNOSIS — M129 Arthropathy, unspecified: Secondary | ICD-10-CM | POA: Diagnosis not present

## 2015-11-23 DIAGNOSIS — N2889 Other specified disorders of kidney and ureter: Secondary | ICD-10-CM | POA: Diagnosis not present

## 2015-12-03 DIAGNOSIS — Z8673 Personal history of transient ischemic attack (TIA), and cerebral infarction without residual deficits: Secondary | ICD-10-CM | POA: Diagnosis not present

## 2015-12-03 DIAGNOSIS — I10 Essential (primary) hypertension: Secondary | ICD-10-CM | POA: Diagnosis not present

## 2015-12-03 DIAGNOSIS — E785 Hyperlipidemia, unspecified: Secondary | ICD-10-CM | POA: Diagnosis not present

## 2015-12-03 DIAGNOSIS — E119 Type 2 diabetes mellitus without complications: Secondary | ICD-10-CM | POA: Diagnosis not present

## 2015-12-03 DIAGNOSIS — Z720 Tobacco use: Secondary | ICD-10-CM | POA: Diagnosis not present

## 2015-12-14 DIAGNOSIS — E119 Type 2 diabetes mellitus without complications: Secondary | ICD-10-CM | POA: Diagnosis not present

## 2015-12-14 DIAGNOSIS — H5213 Myopia, bilateral: Secondary | ICD-10-CM | POA: Diagnosis not present

## 2015-12-14 DIAGNOSIS — Z794 Long term (current) use of insulin: Secondary | ICD-10-CM | POA: Diagnosis not present

## 2015-12-14 DIAGNOSIS — H43393 Other vitreous opacities, bilateral: Secondary | ICD-10-CM | POA: Diagnosis not present

## 2015-12-14 DIAGNOSIS — H2513 Age-related nuclear cataract, bilateral: Secondary | ICD-10-CM | POA: Diagnosis not present

## 2015-12-17 DIAGNOSIS — E785 Hyperlipidemia, unspecified: Secondary | ICD-10-CM | POA: Diagnosis not present

## 2015-12-17 DIAGNOSIS — H9201 Otalgia, right ear: Secondary | ICD-10-CM | POA: Diagnosis not present

## 2015-12-17 DIAGNOSIS — E119 Type 2 diabetes mellitus without complications: Secondary | ICD-10-CM | POA: Diagnosis not present

## 2015-12-17 DIAGNOSIS — R1013 Epigastric pain: Secondary | ICD-10-CM | POA: Diagnosis not present

## 2015-12-17 DIAGNOSIS — K1120 Sialoadenitis, unspecified: Secondary | ICD-10-CM | POA: Diagnosis not present

## 2015-12-17 DIAGNOSIS — E559 Vitamin D deficiency, unspecified: Secondary | ICD-10-CM | POA: Diagnosis not present

## 2015-12-17 DIAGNOSIS — I10 Essential (primary) hypertension: Secondary | ICD-10-CM | POA: Diagnosis not present

## 2016-01-01 DIAGNOSIS — E872 Acidosis: Secondary | ICD-10-CM | POA: Diagnosis not present

## 2016-01-01 DIAGNOSIS — E1129 Type 2 diabetes mellitus with other diabetic kidney complication: Secondary | ICD-10-CM | POA: Diagnosis not present

## 2016-01-01 DIAGNOSIS — E119 Type 2 diabetes mellitus without complications: Secondary | ICD-10-CM | POA: Diagnosis not present

## 2016-01-01 DIAGNOSIS — Z9049 Acquired absence of other specified parts of digestive tract: Secondary | ICD-10-CM | POA: Diagnosis not present

## 2016-01-01 DIAGNOSIS — J449 Chronic obstructive pulmonary disease, unspecified: Secondary | ICD-10-CM | POA: Diagnosis not present

## 2016-01-01 DIAGNOSIS — Z7951 Long term (current) use of inhaled steroids: Secondary | ICD-10-CM | POA: Diagnosis not present

## 2016-01-01 DIAGNOSIS — E1165 Type 2 diabetes mellitus with hyperglycemia: Secondary | ICD-10-CM | POA: Diagnosis not present

## 2016-01-01 DIAGNOSIS — I10 Essential (primary) hypertension: Secondary | ICD-10-CM | POA: Diagnosis not present

## 2016-01-01 DIAGNOSIS — E876 Hypokalemia: Secondary | ICD-10-CM | POA: Diagnosis not present

## 2016-01-01 DIAGNOSIS — R109 Unspecified abdominal pain: Secondary | ICD-10-CM | POA: Diagnosis not present

## 2016-01-01 DIAGNOSIS — K858 Other acute pancreatitis without necrosis or infection: Secondary | ICD-10-CM | POA: Diagnosis not present

## 2016-01-01 DIAGNOSIS — R112 Nausea with vomiting, unspecified: Secondary | ICD-10-CM | POA: Diagnosis not present

## 2016-01-01 DIAGNOSIS — Z8673 Personal history of transient ischemic attack (TIA), and cerebral infarction without residual deficits: Secondary | ICD-10-CM | POA: Diagnosis not present

## 2016-01-01 DIAGNOSIS — R1013 Epigastric pain: Secondary | ICD-10-CM | POA: Diagnosis not present

## 2016-01-01 DIAGNOSIS — Z79899 Other long term (current) drug therapy: Secondary | ICD-10-CM | POA: Diagnosis not present

## 2016-01-01 DIAGNOSIS — N390 Urinary tract infection, site not specified: Secondary | ICD-10-CM | POA: Diagnosis not present

## 2016-01-01 DIAGNOSIS — Z7902 Long term (current) use of antithrombotics/antiplatelets: Secondary | ICD-10-CM | POA: Diagnosis not present

## 2016-01-01 DIAGNOSIS — Z794 Long term (current) use of insulin: Secondary | ICD-10-CM | POA: Diagnosis not present

## 2016-01-01 DIAGNOSIS — K859 Acute pancreatitis without necrosis or infection, unspecified: Secondary | ICD-10-CM | POA: Diagnosis not present

## 2016-01-04 DIAGNOSIS — Z8673 Personal history of transient ischemic attack (TIA), and cerebral infarction without residual deficits: Secondary | ICD-10-CM | POA: Diagnosis not present

## 2016-01-04 DIAGNOSIS — E785 Hyperlipidemia, unspecified: Secondary | ICD-10-CM | POA: Diagnosis not present

## 2016-01-04 DIAGNOSIS — Z720 Tobacco use: Secondary | ICD-10-CM | POA: Diagnosis not present

## 2016-01-04 DIAGNOSIS — I10 Essential (primary) hypertension: Secondary | ICD-10-CM | POA: Diagnosis not present

## 2016-01-04 DIAGNOSIS — E119 Type 2 diabetes mellitus without complications: Secondary | ICD-10-CM | POA: Diagnosis not present

## 2016-01-10 DIAGNOSIS — Z8719 Personal history of other diseases of the digestive system: Secondary | ICD-10-CM | POA: Diagnosis not present

## 2016-01-10 DIAGNOSIS — K2270 Barrett's esophagus without dysplasia: Secondary | ICD-10-CM | POA: Diagnosis not present

## 2016-01-10 DIAGNOSIS — R933 Abnormal findings on diagnostic imaging of other parts of digestive tract: Secondary | ICD-10-CM | POA: Diagnosis not present

## 2016-01-10 DIAGNOSIS — K760 Fatty (change of) liver, not elsewhere classified: Secondary | ICD-10-CM | POA: Diagnosis not present

## 2016-01-10 DIAGNOSIS — K220 Achalasia of cardia: Secondary | ICD-10-CM | POA: Diagnosis not present

## 2016-01-23 DIAGNOSIS — K1120 Sialoadenitis, unspecified: Secondary | ICD-10-CM | POA: Diagnosis not present

## 2016-02-01 DIAGNOSIS — R6884 Jaw pain: Secondary | ICD-10-CM | POA: Diagnosis not present

## 2016-02-01 DIAGNOSIS — K1120 Sialoadenitis, unspecified: Secondary | ICD-10-CM | POA: Diagnosis not present

## 2016-02-06 DIAGNOSIS — Z885 Allergy status to narcotic agent status: Secondary | ICD-10-CM | POA: Diagnosis not present

## 2016-02-06 DIAGNOSIS — Z794 Long term (current) use of insulin: Secondary | ICD-10-CM | POA: Diagnosis not present

## 2016-02-06 DIAGNOSIS — R1012 Left upper quadrant pain: Secondary | ICD-10-CM | POA: Diagnosis not present

## 2016-02-06 DIAGNOSIS — Z7951 Long term (current) use of inhaled steroids: Secondary | ICD-10-CM | POA: Diagnosis not present

## 2016-02-06 DIAGNOSIS — R1011 Right upper quadrant pain: Secondary | ICD-10-CM | POA: Diagnosis not present

## 2016-02-06 DIAGNOSIS — Z833 Family history of diabetes mellitus: Secondary | ICD-10-CM | POA: Diagnosis not present

## 2016-02-06 DIAGNOSIS — I10 Essential (primary) hypertension: Secondary | ICD-10-CM | POA: Diagnosis not present

## 2016-02-06 DIAGNOSIS — N390 Urinary tract infection, site not specified: Secondary | ICD-10-CM | POA: Diagnosis not present

## 2016-02-06 DIAGNOSIS — Z8673 Personal history of transient ischemic attack (TIA), and cerebral infarction without residual deficits: Secondary | ICD-10-CM | POA: Diagnosis not present

## 2016-02-06 DIAGNOSIS — J984 Other disorders of lung: Secondary | ICD-10-CM | POA: Diagnosis not present

## 2016-02-06 DIAGNOSIS — R1013 Epigastric pain: Secondary | ICD-10-CM | POA: Diagnosis not present

## 2016-02-06 DIAGNOSIS — J449 Chronic obstructive pulmonary disease, unspecified: Secondary | ICD-10-CM | POA: Diagnosis not present

## 2016-02-06 DIAGNOSIS — R938 Abnormal findings on diagnostic imaging of other specified body structures: Secondary | ICD-10-CM | POA: Diagnosis not present

## 2016-02-06 DIAGNOSIS — Z8249 Family history of ischemic heart disease and other diseases of the circulatory system: Secondary | ICD-10-CM | POA: Diagnosis not present

## 2016-02-06 DIAGNOSIS — R569 Unspecified convulsions: Secondary | ICD-10-CM | POA: Diagnosis not present

## 2016-02-06 DIAGNOSIS — E118 Type 2 diabetes mellitus with unspecified complications: Secondary | ICD-10-CM | POA: Diagnosis not present

## 2016-02-06 DIAGNOSIS — R110 Nausea: Secondary | ICD-10-CM | POA: Diagnosis not present

## 2016-02-11 DIAGNOSIS — Z859 Personal history of malignant neoplasm, unspecified: Secondary | ICD-10-CM | POA: Diagnosis not present

## 2016-02-11 DIAGNOSIS — R110 Nausea: Secondary | ICD-10-CM | POA: Diagnosis not present

## 2016-02-11 DIAGNOSIS — Z7951 Long term (current) use of inhaled steroids: Secondary | ICD-10-CM | POA: Diagnosis not present

## 2016-02-11 DIAGNOSIS — E119 Type 2 diabetes mellitus without complications: Secondary | ICD-10-CM | POA: Diagnosis not present

## 2016-02-11 DIAGNOSIS — R109 Unspecified abdominal pain: Secondary | ICD-10-CM | POA: Diagnosis not present

## 2016-02-11 DIAGNOSIS — Z8249 Family history of ischemic heart disease and other diseases of the circulatory system: Secondary | ICD-10-CM | POA: Diagnosis not present

## 2016-02-11 DIAGNOSIS — R112 Nausea with vomiting, unspecified: Secondary | ICD-10-CM | POA: Diagnosis not present

## 2016-02-11 DIAGNOSIS — Z8673 Personal history of transient ischemic attack (TIA), and cerebral infarction without residual deficits: Secondary | ICD-10-CM | POA: Diagnosis not present

## 2016-02-11 DIAGNOSIS — Z794 Long term (current) use of insulin: Secondary | ICD-10-CM | POA: Diagnosis not present

## 2016-02-11 DIAGNOSIS — N3000 Acute cystitis without hematuria: Secondary | ICD-10-CM | POA: Diagnosis not present

## 2016-02-11 DIAGNOSIS — R569 Unspecified convulsions: Secondary | ICD-10-CM | POA: Diagnosis not present

## 2016-02-11 DIAGNOSIS — I10 Essential (primary) hypertension: Secondary | ICD-10-CM | POA: Diagnosis not present

## 2016-02-11 DIAGNOSIS — R011 Cardiac murmur, unspecified: Secondary | ICD-10-CM | POA: Diagnosis not present

## 2016-02-11 DIAGNOSIS — E876 Hypokalemia: Secondary | ICD-10-CM | POA: Diagnosis not present

## 2016-02-11 DIAGNOSIS — Z885 Allergy status to narcotic agent status: Secondary | ICD-10-CM | POA: Diagnosis not present

## 2016-02-11 DIAGNOSIS — R6883 Chills (without fever): Secondary | ICD-10-CM | POA: Diagnosis not present

## 2016-02-11 DIAGNOSIS — J449 Chronic obstructive pulmonary disease, unspecified: Secondary | ICD-10-CM | POA: Diagnosis not present

## 2016-02-11 DIAGNOSIS — K2970 Gastritis, unspecified, without bleeding: Secondary | ICD-10-CM | POA: Diagnosis not present

## 2016-02-13 DIAGNOSIS — N281 Cyst of kidney, acquired: Secondary | ICD-10-CM | POA: Diagnosis not present

## 2016-02-13 DIAGNOSIS — N2889 Other specified disorders of kidney and ureter: Secondary | ICD-10-CM | POA: Diagnosis not present

## 2016-02-14 DIAGNOSIS — R1011 Right upper quadrant pain: Secondary | ICD-10-CM | POA: Diagnosis not present

## 2016-02-14 DIAGNOSIS — R112 Nausea with vomiting, unspecified: Secondary | ICD-10-CM | POA: Diagnosis not present

## 2016-02-14 DIAGNOSIS — Z7901 Long term (current) use of anticoagulants: Secondary | ICD-10-CM | POA: Diagnosis not present

## 2016-02-14 DIAGNOSIS — Z8673 Personal history of transient ischemic attack (TIA), and cerebral infarction without residual deficits: Secondary | ICD-10-CM | POA: Diagnosis not present

## 2016-02-14 DIAGNOSIS — K2270 Barrett's esophagus without dysplasia: Secondary | ICD-10-CM | POA: Diagnosis not present

## 2016-02-15 DIAGNOSIS — Z6824 Body mass index (BMI) 24.0-24.9, adult: Secondary | ICD-10-CM | POA: Diagnosis not present

## 2016-02-15 DIAGNOSIS — E119 Type 2 diabetes mellitus without complications: Secondary | ICD-10-CM | POA: Diagnosis not present

## 2016-02-15 DIAGNOSIS — E785 Hyperlipidemia, unspecified: Secondary | ICD-10-CM | POA: Diagnosis not present

## 2016-02-15 DIAGNOSIS — I10 Essential (primary) hypertension: Secondary | ICD-10-CM | POA: Diagnosis not present

## 2016-02-20 DIAGNOSIS — Z0000 Encounter for general adult medical examination without abnormal findings: Secondary | ICD-10-CM | POA: Diagnosis not present

## 2016-02-20 DIAGNOSIS — K319 Disease of stomach and duodenum, unspecified: Secondary | ICD-10-CM | POA: Diagnosis not present

## 2016-02-20 DIAGNOSIS — K269 Duodenal ulcer, unspecified as acute or chronic, without hemorrhage or perforation: Secondary | ICD-10-CM | POA: Diagnosis not present

## 2016-02-20 DIAGNOSIS — R1013 Epigastric pain: Secondary | ICD-10-CM | POA: Diagnosis not present

## 2016-02-20 DIAGNOSIS — K253 Acute gastric ulcer without hemorrhage or perforation: Secondary | ICD-10-CM | POA: Diagnosis not present

## 2016-02-25 DIAGNOSIS — Z0000 Encounter for general adult medical examination without abnormal findings: Secondary | ICD-10-CM | POA: Diagnosis not present

## 2016-02-25 DIAGNOSIS — E119 Type 2 diabetes mellitus without complications: Secondary | ICD-10-CM | POA: Diagnosis not present

## 2016-02-25 DIAGNOSIS — E785 Hyperlipidemia, unspecified: Secondary | ICD-10-CM | POA: Diagnosis not present

## 2016-02-25 DIAGNOSIS — R05 Cough: Secondary | ICD-10-CM | POA: Diagnosis not present

## 2016-02-25 DIAGNOSIS — I10 Essential (primary) hypertension: Secondary | ICD-10-CM | POA: Diagnosis not present

## 2016-02-25 DIAGNOSIS — H9202 Otalgia, left ear: Secondary | ICD-10-CM | POA: Diagnosis not present

## 2016-03-14 DIAGNOSIS — H903 Sensorineural hearing loss, bilateral: Secondary | ICD-10-CM | POA: Diagnosis not present

## 2016-04-08 ENCOUNTER — Other Ambulatory Visit: Payer: Self-pay | Admitting: Physician Assistant

## 2016-04-08 DIAGNOSIS — Z720 Tobacco use: Secondary | ICD-10-CM | POA: Diagnosis not present

## 2016-04-08 DIAGNOSIS — E119 Type 2 diabetes mellitus without complications: Secondary | ICD-10-CM | POA: Diagnosis not present

## 2016-04-08 DIAGNOSIS — M79605 Pain in left leg: Secondary | ICD-10-CM | POA: Diagnosis not present

## 2016-04-08 DIAGNOSIS — M79662 Pain in left lower leg: Secondary | ICD-10-CM

## 2016-04-08 DIAGNOSIS — I10 Essential (primary) hypertension: Secondary | ICD-10-CM | POA: Diagnosis not present

## 2016-04-08 DIAGNOSIS — E785 Hyperlipidemia, unspecified: Secondary | ICD-10-CM | POA: Diagnosis not present

## 2016-04-11 ENCOUNTER — Inpatient Hospital Stay: Admission: RE | Admit: 2016-04-11 | Payer: Self-pay | Source: Ambulatory Visit

## 2016-05-05 DIAGNOSIS — R1012 Left upper quadrant pain: Secondary | ICD-10-CM | POA: Diagnosis not present

## 2016-05-05 DIAGNOSIS — K5900 Constipation, unspecified: Secondary | ICD-10-CM | POA: Diagnosis not present

## 2016-05-05 DIAGNOSIS — R110 Nausea: Secondary | ICD-10-CM | POA: Diagnosis not present

## 2016-05-06 DIAGNOSIS — E119 Type 2 diabetes mellitus without complications: Secondary | ICD-10-CM | POA: Diagnosis not present

## 2016-05-06 DIAGNOSIS — Z8673 Personal history of transient ischemic attack (TIA), and cerebral infarction without residual deficits: Secondary | ICD-10-CM | POA: Diagnosis not present

## 2016-05-06 DIAGNOSIS — E785 Hyperlipidemia, unspecified: Secondary | ICD-10-CM | POA: Diagnosis not present

## 2016-05-06 DIAGNOSIS — Z720 Tobacco use: Secondary | ICD-10-CM | POA: Diagnosis not present

## 2016-05-06 DIAGNOSIS — I10 Essential (primary) hypertension: Secondary | ICD-10-CM | POA: Diagnosis not present

## 2016-05-08 ENCOUNTER — Ambulatory Visit
Admission: RE | Admit: 2016-05-08 | Discharge: 2016-05-08 | Disposition: A | Discharge status: Home / Self Care | Payer: Medicaid Other | Source: Ambulatory Visit | Attending: Physician Assistant | Admitting: Physician Assistant

## 2016-05-08 DIAGNOSIS — R1012 Left upper quadrant pain: Secondary | ICD-10-CM | POA: Diagnosis not present

## 2016-05-08 DIAGNOSIS — M79662 Pain in left lower leg: Secondary | ICD-10-CM

## 2016-05-08 DIAGNOSIS — K5900 Constipation, unspecified: Secondary | ICD-10-CM | POA: Diagnosis not present

## 2016-05-08 DIAGNOSIS — M79605 Pain in left leg: Secondary | ICD-10-CM | POA: Diagnosis not present

## 2016-06-04 DIAGNOSIS — K279 Peptic ulcer, site unspecified, unspecified as acute or chronic, without hemorrhage or perforation: Secondary | ICD-10-CM | POA: Diagnosis not present

## 2016-06-04 DIAGNOSIS — R1310 Dysphagia, unspecified: Secondary | ICD-10-CM | POA: Diagnosis not present

## 2016-07-03 DIAGNOSIS — M79605 Pain in left leg: Secondary | ICD-10-CM | POA: Diagnosis not present

## 2016-07-03 DIAGNOSIS — Z720 Tobacco use: Secondary | ICD-10-CM | POA: Diagnosis not present

## 2016-07-03 DIAGNOSIS — E785 Hyperlipidemia, unspecified: Secondary | ICD-10-CM | POA: Diagnosis not present

## 2016-07-03 DIAGNOSIS — I10 Essential (primary) hypertension: Secondary | ICD-10-CM | POA: Diagnosis not present

## 2016-07-03 DIAGNOSIS — E119 Type 2 diabetes mellitus without complications: Secondary | ICD-10-CM | POA: Diagnosis not present

## 2016-07-03 DIAGNOSIS — Z8673 Personal history of transient ischemic attack (TIA), and cerebral infarction without residual deficits: Secondary | ICD-10-CM | POA: Diagnosis not present

## 2016-07-04 DIAGNOSIS — Z856 Personal history of leukemia: Secondary | ICD-10-CM | POA: Diagnosis not present

## 2016-07-04 DIAGNOSIS — R569 Unspecified convulsions: Secondary | ICD-10-CM | POA: Diagnosis not present

## 2016-07-04 DIAGNOSIS — Z8673 Personal history of transient ischemic attack (TIA), and cerebral infarction without residual deficits: Secondary | ICD-10-CM | POA: Diagnosis not present

## 2016-07-04 DIAGNOSIS — E119 Type 2 diabetes mellitus without complications: Secondary | ICD-10-CM | POA: Diagnosis not present

## 2016-07-04 DIAGNOSIS — J449 Chronic obstructive pulmonary disease, unspecified: Secondary | ICD-10-CM | POA: Diagnosis not present

## 2016-07-04 DIAGNOSIS — I10 Essential (primary) hypertension: Secondary | ICD-10-CM | POA: Diagnosis not present

## 2016-07-04 DIAGNOSIS — S8012XA Contusion of left lower leg, initial encounter: Secondary | ICD-10-CM | POA: Diagnosis not present

## 2016-07-04 DIAGNOSIS — M79605 Pain in left leg: Secondary | ICD-10-CM | POA: Diagnosis not present

## 2016-07-08 DIAGNOSIS — R1310 Dysphagia, unspecified: Secondary | ICD-10-CM | POA: Diagnosis not present

## 2016-07-08 DIAGNOSIS — R1312 Dysphagia, oropharyngeal phase: Secondary | ICD-10-CM | POA: Diagnosis not present

## 2016-07-16 DIAGNOSIS — R1012 Left upper quadrant pain: Secondary | ICD-10-CM | POA: Diagnosis not present

## 2016-07-16 DIAGNOSIS — I10 Essential (primary) hypertension: Secondary | ICD-10-CM | POA: Diagnosis not present

## 2016-07-16 DIAGNOSIS — R1310 Dysphagia, unspecified: Secondary | ICD-10-CM | POA: Diagnosis not present

## 2016-07-16 DIAGNOSIS — Z7902 Long term (current) use of antithrombotics/antiplatelets: Secondary | ICD-10-CM | POA: Diagnosis not present

## 2016-07-16 DIAGNOSIS — K922 Gastrointestinal hemorrhage, unspecified: Secondary | ICD-10-CM | POA: Diagnosis not present

## 2016-07-16 DIAGNOSIS — R112 Nausea with vomiting, unspecified: Secondary | ICD-10-CM | POA: Diagnosis not present

## 2016-07-16 DIAGNOSIS — K2950 Unspecified chronic gastritis without bleeding: Secondary | ICD-10-CM | POA: Diagnosis not present

## 2016-07-16 DIAGNOSIS — R109 Unspecified abdominal pain: Secondary | ICD-10-CM | POA: Diagnosis not present

## 2016-07-16 DIAGNOSIS — E785 Hyperlipidemia, unspecified: Secondary | ICD-10-CM | POA: Diagnosis not present

## 2016-07-16 DIAGNOSIS — R935 Abnormal findings on diagnostic imaging of other abdominal regions, including retroperitoneum: Secondary | ICD-10-CM | POA: Diagnosis not present

## 2016-07-16 DIAGNOSIS — R1013 Epigastric pain: Secondary | ICD-10-CM | POA: Diagnosis not present

## 2016-07-16 DIAGNOSIS — K760 Fatty (change of) liver, not elsewhere classified: Secondary | ICD-10-CM | POA: Diagnosis not present

## 2016-07-16 DIAGNOSIS — K2990 Gastroduodenitis, unspecified, without bleeding: Secondary | ICD-10-CM | POA: Diagnosis not present

## 2016-07-16 DIAGNOSIS — J449 Chronic obstructive pulmonary disease, unspecified: Secondary | ICD-10-CM | POA: Diagnosis not present

## 2016-07-16 DIAGNOSIS — K2970 Gastritis, unspecified, without bleeding: Secondary | ICD-10-CM | POA: Diagnosis not present

## 2016-07-16 DIAGNOSIS — R197 Diarrhea, unspecified: Secondary | ICD-10-CM | POA: Diagnosis not present

## 2016-07-16 DIAGNOSIS — E119 Type 2 diabetes mellitus without complications: Secondary | ICD-10-CM | POA: Diagnosis not present

## 2016-07-16 DIAGNOSIS — K2981 Duodenitis with bleeding: Secondary | ICD-10-CM | POA: Diagnosis not present

## 2016-07-16 DIAGNOSIS — R945 Abnormal results of liver function studies: Secondary | ICD-10-CM | POA: Diagnosis not present

## 2016-07-16 DIAGNOSIS — R10813 Right lower quadrant abdominal tenderness: Secondary | ICD-10-CM | POA: Diagnosis not present

## 2016-07-16 DIAGNOSIS — M5136 Other intervertebral disc degeneration, lumbar region: Secondary | ICD-10-CM | POA: Diagnosis not present

## 2016-07-16 DIAGNOSIS — R748 Abnormal levels of other serum enzymes: Secondary | ICD-10-CM | POA: Diagnosis not present

## 2016-07-16 DIAGNOSIS — E876 Hypokalemia: Secondary | ICD-10-CM | POA: Diagnosis not present

## 2016-07-16 DIAGNOSIS — G4730 Sleep apnea, unspecified: Secondary | ICD-10-CM | POA: Diagnosis not present

## 2016-07-16 DIAGNOSIS — Z8673 Personal history of transient ischemic attack (TIA), and cerebral infarction without residual deficits: Secondary | ICD-10-CM | POA: Diagnosis not present

## 2016-07-16 DIAGNOSIS — Z794 Long term (current) use of insulin: Secondary | ICD-10-CM | POA: Diagnosis not present

## 2016-07-16 DIAGNOSIS — E786 Lipoprotein deficiency: Secondary | ICD-10-CM | POA: Diagnosis not present

## 2016-07-16 DIAGNOSIS — R1010 Upper abdominal pain, unspecified: Secondary | ICD-10-CM | POA: Diagnosis not present

## 2016-07-16 DIAGNOSIS — Z79899 Other long term (current) drug therapy: Secondary | ICD-10-CM | POA: Diagnosis not present

## 2016-07-16 DIAGNOSIS — R7989 Other specified abnormal findings of blood chemistry: Secondary | ICD-10-CM | POA: Diagnosis not present

## 2016-07-16 DIAGNOSIS — N390 Urinary tract infection, site not specified: Secondary | ICD-10-CM | POA: Diagnosis not present

## 2016-07-16 DIAGNOSIS — K920 Hematemesis: Secondary | ICD-10-CM | POA: Diagnosis not present

## 2016-07-16 DIAGNOSIS — K2901 Acute gastritis with bleeding: Secondary | ICD-10-CM | POA: Diagnosis not present

## 2016-07-17 DIAGNOSIS — E119 Type 2 diabetes mellitus without complications: Secondary | ICD-10-CM | POA: Diagnosis not present

## 2016-07-17 DIAGNOSIS — E785 Hyperlipidemia, unspecified: Secondary | ICD-10-CM | POA: Diagnosis not present

## 2016-07-17 DIAGNOSIS — K2981 Duodenitis with bleeding: Secondary | ICD-10-CM | POA: Diagnosis not present

## 2016-07-17 DIAGNOSIS — I10 Essential (primary) hypertension: Secondary | ICD-10-CM | POA: Diagnosis not present

## 2016-07-17 DIAGNOSIS — N390 Urinary tract infection, site not specified: Secondary | ICD-10-CM | POA: Diagnosis not present

## 2016-07-17 DIAGNOSIS — R1310 Dysphagia, unspecified: Secondary | ICD-10-CM | POA: Diagnosis not present

## 2016-07-17 DIAGNOSIS — M5136 Other intervertebral disc degeneration, lumbar region: Secondary | ICD-10-CM | POA: Diagnosis not present

## 2016-07-17 DIAGNOSIS — E876 Hypokalemia: Secondary | ICD-10-CM | POA: Diagnosis not present

## 2016-07-17 DIAGNOSIS — Z7902 Long term (current) use of antithrombotics/antiplatelets: Secondary | ICD-10-CM | POA: Diagnosis not present

## 2016-07-17 DIAGNOSIS — Z794 Long term (current) use of insulin: Secondary | ICD-10-CM | POA: Diagnosis not present

## 2016-07-17 DIAGNOSIS — Z79899 Other long term (current) drug therapy: Secondary | ICD-10-CM | POA: Diagnosis not present

## 2016-07-17 DIAGNOSIS — K2901 Acute gastritis with bleeding: Secondary | ICD-10-CM | POA: Diagnosis not present

## 2016-07-17 DIAGNOSIS — J449 Chronic obstructive pulmonary disease, unspecified: Secondary | ICD-10-CM | POA: Diagnosis not present

## 2016-07-17 DIAGNOSIS — K760 Fatty (change of) liver, not elsewhere classified: Secondary | ICD-10-CM | POA: Diagnosis not present

## 2016-07-17 DIAGNOSIS — Z8673 Personal history of transient ischemic attack (TIA), and cerebral infarction without residual deficits: Secondary | ICD-10-CM | POA: Diagnosis not present

## 2016-07-17 DIAGNOSIS — G4730 Sleep apnea, unspecified: Secondary | ICD-10-CM | POA: Diagnosis not present

## 2016-07-18 DIAGNOSIS — M5136 Other intervertebral disc degeneration, lumbar region: Secondary | ICD-10-CM | POA: Diagnosis not present

## 2016-07-18 DIAGNOSIS — R1310 Dysphagia, unspecified: Secondary | ICD-10-CM | POA: Diagnosis not present

## 2016-07-18 DIAGNOSIS — G4730 Sleep apnea, unspecified: Secondary | ICD-10-CM | POA: Diagnosis not present

## 2016-07-18 DIAGNOSIS — K2981 Duodenitis with bleeding: Secondary | ICD-10-CM | POA: Diagnosis not present

## 2016-07-18 DIAGNOSIS — E876 Hypokalemia: Secondary | ICD-10-CM | POA: Diagnosis not present

## 2016-07-18 DIAGNOSIS — Z794 Long term (current) use of insulin: Secondary | ICD-10-CM | POA: Diagnosis not present

## 2016-07-18 DIAGNOSIS — I10 Essential (primary) hypertension: Secondary | ICD-10-CM | POA: Diagnosis not present

## 2016-07-18 DIAGNOSIS — Z8673 Personal history of transient ischemic attack (TIA), and cerebral infarction without residual deficits: Secondary | ICD-10-CM | POA: Diagnosis not present

## 2016-07-18 DIAGNOSIS — E785 Hyperlipidemia, unspecified: Secondary | ICD-10-CM | POA: Diagnosis not present

## 2016-07-18 DIAGNOSIS — Z79899 Other long term (current) drug therapy: Secondary | ICD-10-CM | POA: Diagnosis not present

## 2016-07-18 DIAGNOSIS — N390 Urinary tract infection, site not specified: Secondary | ICD-10-CM | POA: Diagnosis not present

## 2016-07-18 DIAGNOSIS — E119 Type 2 diabetes mellitus without complications: Secondary | ICD-10-CM | POA: Diagnosis not present

## 2016-07-18 DIAGNOSIS — K2901 Acute gastritis with bleeding: Secondary | ICD-10-CM | POA: Diagnosis not present

## 2016-07-18 DIAGNOSIS — Z7902 Long term (current) use of antithrombotics/antiplatelets: Secondary | ICD-10-CM | POA: Diagnosis not present

## 2016-07-18 DIAGNOSIS — K760 Fatty (change of) liver, not elsewhere classified: Secondary | ICD-10-CM | POA: Diagnosis not present

## 2016-07-18 DIAGNOSIS — J449 Chronic obstructive pulmonary disease, unspecified: Secondary | ICD-10-CM | POA: Diagnosis not present

## 2016-07-19 DIAGNOSIS — K2981 Duodenitis with bleeding: Secondary | ICD-10-CM | POA: Diagnosis not present

## 2016-07-19 DIAGNOSIS — E785 Hyperlipidemia, unspecified: Secondary | ICD-10-CM | POA: Diagnosis not present

## 2016-07-19 DIAGNOSIS — M5136 Other intervertebral disc degeneration, lumbar region: Secondary | ICD-10-CM | POA: Diagnosis not present

## 2016-07-19 DIAGNOSIS — K2901 Acute gastritis with bleeding: Secondary | ICD-10-CM | POA: Diagnosis not present

## 2016-07-19 DIAGNOSIS — Z8673 Personal history of transient ischemic attack (TIA), and cerebral infarction without residual deficits: Secondary | ICD-10-CM | POA: Diagnosis not present

## 2016-07-19 DIAGNOSIS — G4730 Sleep apnea, unspecified: Secondary | ICD-10-CM | POA: Diagnosis not present

## 2016-07-19 DIAGNOSIS — Z7902 Long term (current) use of antithrombotics/antiplatelets: Secondary | ICD-10-CM | POA: Diagnosis not present

## 2016-07-19 DIAGNOSIS — J449 Chronic obstructive pulmonary disease, unspecified: Secondary | ICD-10-CM | POA: Diagnosis not present

## 2016-07-19 DIAGNOSIS — E876 Hypokalemia: Secondary | ICD-10-CM | POA: Diagnosis not present

## 2016-07-19 DIAGNOSIS — R1310 Dysphagia, unspecified: Secondary | ICD-10-CM | POA: Diagnosis not present

## 2016-07-19 DIAGNOSIS — E119 Type 2 diabetes mellitus without complications: Secondary | ICD-10-CM | POA: Diagnosis not present

## 2016-07-19 DIAGNOSIS — Z79899 Other long term (current) drug therapy: Secondary | ICD-10-CM | POA: Diagnosis not present

## 2016-07-19 DIAGNOSIS — Z794 Long term (current) use of insulin: Secondary | ICD-10-CM | POA: Diagnosis not present

## 2016-07-19 DIAGNOSIS — I10 Essential (primary) hypertension: Secondary | ICD-10-CM | POA: Diagnosis not present

## 2016-07-19 DIAGNOSIS — K760 Fatty (change of) liver, not elsewhere classified: Secondary | ICD-10-CM | POA: Diagnosis not present

## 2016-07-19 DIAGNOSIS — N390 Urinary tract infection, site not specified: Secondary | ICD-10-CM | POA: Diagnosis not present

## 2016-08-05 DIAGNOSIS — M25562 Pain in left knee: Secondary | ICD-10-CM | POA: Diagnosis not present

## 2016-08-05 DIAGNOSIS — M25561 Pain in right knee: Secondary | ICD-10-CM | POA: Diagnosis not present

## 2016-08-05 DIAGNOSIS — M1711 Unilateral primary osteoarthritis, right knee: Secondary | ICD-10-CM | POA: Diagnosis not present

## 2016-08-05 DIAGNOSIS — E0800 Diabetes mellitus due to underlying condition with hyperosmolarity without nonketotic hyperglycemic-hyperosmolar coma (NKHHC): Secondary | ICD-10-CM | POA: Diagnosis not present

## 2016-08-05 DIAGNOSIS — M1712 Unilateral primary osteoarthritis, left knee: Secondary | ICD-10-CM | POA: Diagnosis not present

## 2016-08-26 DIAGNOSIS — R110 Nausea: Secondary | ICD-10-CM | POA: Diagnosis not present

## 2016-08-26 DIAGNOSIS — K5900 Constipation, unspecified: Secondary | ICD-10-CM | POA: Diagnosis not present

## 2016-08-26 DIAGNOSIS — R1012 Left upper quadrant pain: Secondary | ICD-10-CM | POA: Diagnosis not present

## 2016-08-26 DIAGNOSIS — K220 Achalasia of cardia: Secondary | ICD-10-CM | POA: Diagnosis not present

## 2016-09-04 DIAGNOSIS — R1012 Left upper quadrant pain: Secondary | ICD-10-CM | POA: Diagnosis not present

## 2016-09-04 DIAGNOSIS — R110 Nausea: Secondary | ICD-10-CM | POA: Diagnosis not present

## 2016-09-15 DIAGNOSIS — E119 Type 2 diabetes mellitus without complications: Secondary | ICD-10-CM | POA: Diagnosis not present

## 2016-09-15 DIAGNOSIS — Z720 Tobacco use: Secondary | ICD-10-CM | POA: Diagnosis not present

## 2016-09-15 DIAGNOSIS — E785 Hyperlipidemia, unspecified: Secondary | ICD-10-CM | POA: Diagnosis not present

## 2016-09-15 DIAGNOSIS — I10 Essential (primary) hypertension: Secondary | ICD-10-CM | POA: Diagnosis not present

## 2016-09-15 DIAGNOSIS — Z0000 Encounter for general adult medical examination without abnormal findings: Secondary | ICD-10-CM | POA: Diagnosis not present

## 2016-09-23 DIAGNOSIS — E119 Type 2 diabetes mellitus without complications: Secondary | ICD-10-CM | POA: Diagnosis not present

## 2016-09-23 DIAGNOSIS — I10 Essential (primary) hypertension: Secondary | ICD-10-CM | POA: Diagnosis not present

## 2016-09-23 DIAGNOSIS — Z7689 Persons encountering health services in other specified circumstances: Secondary | ICD-10-CM | POA: Diagnosis not present

## 2016-09-23 DIAGNOSIS — Z8673 Personal history of transient ischemic attack (TIA), and cerebral infarction without residual deficits: Secondary | ICD-10-CM | POA: Diagnosis not present

## 2016-09-23 DIAGNOSIS — E785 Hyperlipidemia, unspecified: Secondary | ICD-10-CM | POA: Diagnosis not present

## 2016-09-23 DIAGNOSIS — Z720 Tobacco use: Secondary | ICD-10-CM | POA: Diagnosis not present

## 2016-09-23 DIAGNOSIS — K1120 Sialoadenitis, unspecified: Secondary | ICD-10-CM | POA: Diagnosis not present

## 2016-10-06 DIAGNOSIS — M545 Low back pain: Secondary | ICD-10-CM | POA: Diagnosis not present

## 2016-10-06 DIAGNOSIS — Z720 Tobacco use: Secondary | ICD-10-CM | POA: Diagnosis not present

## 2016-10-06 DIAGNOSIS — E785 Hyperlipidemia, unspecified: Secondary | ICD-10-CM | POA: Diagnosis not present

## 2016-10-06 DIAGNOSIS — I10 Essential (primary) hypertension: Secondary | ICD-10-CM | POA: Diagnosis not present

## 2016-10-06 DIAGNOSIS — Z8673 Personal history of transient ischemic attack (TIA), and cerebral infarction without residual deficits: Secondary | ICD-10-CM | POA: Diagnosis not present

## 2016-10-06 DIAGNOSIS — E119 Type 2 diabetes mellitus without complications: Secondary | ICD-10-CM | POA: Diagnosis not present

## 2016-10-13 DIAGNOSIS — R1012 Left upper quadrant pain: Secondary | ICD-10-CM | POA: Diagnosis not present

## 2016-10-13 DIAGNOSIS — R110 Nausea: Secondary | ICD-10-CM | POA: Diagnosis not present

## 2016-10-14 DIAGNOSIS — E119 Type 2 diabetes mellitus without complications: Secondary | ICD-10-CM | POA: Diagnosis not present

## 2016-10-14 DIAGNOSIS — M2041 Other hammer toe(s) (acquired), right foot: Secondary | ICD-10-CM | POA: Diagnosis not present

## 2016-10-14 DIAGNOSIS — M2012 Hallux valgus (acquired), left foot: Secondary | ICD-10-CM | POA: Diagnosis not present

## 2016-10-14 DIAGNOSIS — M2042 Other hammer toe(s) (acquired), left foot: Secondary | ICD-10-CM | POA: Diagnosis not present

## 2016-10-14 DIAGNOSIS — M2011 Hallux valgus (acquired), right foot: Secondary | ICD-10-CM | POA: Diagnosis not present

## 2016-10-17 DIAGNOSIS — E118 Type 2 diabetes mellitus with unspecified complications: Secondary | ICD-10-CM | POA: Diagnosis not present

## 2016-10-17 DIAGNOSIS — I10 Essential (primary) hypertension: Secondary | ICD-10-CM | POA: Diagnosis not present

## 2016-10-17 DIAGNOSIS — Z8673 Personal history of transient ischemic attack (TIA), and cerebral infarction without residual deficits: Secondary | ICD-10-CM | POA: Diagnosis not present

## 2016-10-17 DIAGNOSIS — K1122 Acute recurrent sialoadenitis: Secondary | ICD-10-CM | POA: Diagnosis not present

## 2016-10-17 DIAGNOSIS — B370 Candidal stomatitis: Secondary | ICD-10-CM | POA: Diagnosis not present

## 2016-11-10 DIAGNOSIS — K5900 Constipation, unspecified: Secondary | ICD-10-CM | POA: Diagnosis not present

## 2016-11-10 DIAGNOSIS — R110 Nausea: Secondary | ICD-10-CM | POA: Diagnosis not present

## 2016-11-10 DIAGNOSIS — R1010 Upper abdominal pain, unspecified: Secondary | ICD-10-CM | POA: Diagnosis not present

## 2016-11-14 DIAGNOSIS — D473 Essential (hemorrhagic) thrombocythemia: Secondary | ICD-10-CM | POA: Diagnosis not present

## 2016-11-14 DIAGNOSIS — R600 Localized edema: Secondary | ICD-10-CM | POA: Diagnosis not present

## 2016-11-14 DIAGNOSIS — M79605 Pain in left leg: Secondary | ICD-10-CM | POA: Diagnosis not present

## 2016-11-27 DIAGNOSIS — R51 Headache: Secondary | ICD-10-CM | POA: Diagnosis not present

## 2019-12-23 ENCOUNTER — Emergency Department (HOSPITAL_BASED_OUTPATIENT_CLINIC_OR_DEPARTMENT_OTHER): Payer: 59

## 2019-12-23 ENCOUNTER — Inpatient Hospital Stay (HOSPITAL_BASED_OUTPATIENT_CLINIC_OR_DEPARTMENT_OTHER)
Admission: EM | Admit: 2019-12-23 | Discharge: 2019-12-25 | DRG: 684 | Disposition: A | Discharge status: Home / Self Care | Payer: 59 | Attending: Internal Medicine | Admitting: Internal Medicine

## 2019-12-23 ENCOUNTER — Other Ambulatory Visit: Payer: Self-pay

## 2019-12-23 ENCOUNTER — Encounter (HOSPITAL_BASED_OUTPATIENT_CLINIC_OR_DEPARTMENT_OTHER): Payer: Self-pay | Admitting: Emergency Medicine

## 2019-12-23 DIAGNOSIS — R338 Other retention of urine: Secondary | ICD-10-CM

## 2019-12-23 DIAGNOSIS — M549 Dorsalgia, unspecified: Secondary | ICD-10-CM | POA: Diagnosis present

## 2019-12-23 DIAGNOSIS — R339 Retention of urine, unspecified: Secondary | ICD-10-CM | POA: Diagnosis present

## 2019-12-23 DIAGNOSIS — N390 Urinary tract infection, site not specified: Secondary | ICD-10-CM

## 2019-12-23 DIAGNOSIS — R42 Dizziness and giddiness: Secondary | ICD-10-CM

## 2019-12-23 DIAGNOSIS — D649 Anemia, unspecified: Secondary | ICD-10-CM | POA: Diagnosis present

## 2019-12-23 DIAGNOSIS — I10 Essential (primary) hypertension: Secondary | ICD-10-CM | POA: Diagnosis present

## 2019-12-23 DIAGNOSIS — N179 Acute kidney failure, unspecified: Secondary | ICD-10-CM | POA: Diagnosis not present

## 2019-12-23 DIAGNOSIS — Z794 Long term (current) use of insulin: Secondary | ICD-10-CM

## 2019-12-23 DIAGNOSIS — Z882 Allergy status to sulfonamides status: Secondary | ICD-10-CM

## 2019-12-23 DIAGNOSIS — E1165 Type 2 diabetes mellitus with hyperglycemia: Secondary | ICD-10-CM | POA: Diagnosis present

## 2019-12-23 DIAGNOSIS — R519 Headache, unspecified: Secondary | ICD-10-CM | POA: Diagnosis not present

## 2019-12-23 DIAGNOSIS — Z20822 Contact with and (suspected) exposure to covid-19: Secondary | ICD-10-CM | POA: Diagnosis present

## 2019-12-23 DIAGNOSIS — N136 Pyonephrosis: Secondary | ICD-10-CM | POA: Diagnosis present

## 2019-12-23 DIAGNOSIS — E860 Dehydration: Secondary | ICD-10-CM | POA: Diagnosis present

## 2019-12-23 DIAGNOSIS — I959 Hypotension, unspecified: Secondary | ICD-10-CM

## 2019-12-23 DIAGNOSIS — Z8673 Personal history of transient ischemic attack (TIA), and cerebral infarction without residual deficits: Secondary | ICD-10-CM

## 2019-12-23 DIAGNOSIS — Z79899 Other long term (current) drug therapy: Secondary | ICD-10-CM

## 2019-12-23 DIAGNOSIS — F17210 Nicotine dependence, cigarettes, uncomplicated: Secondary | ICD-10-CM | POA: Diagnosis present

## 2019-12-23 DIAGNOSIS — Z7902 Long term (current) use of antithrombotics/antiplatelets: Secondary | ICD-10-CM

## 2019-12-23 DIAGNOSIS — Z885 Allergy status to narcotic agent status: Secondary | ICD-10-CM

## 2019-12-23 DIAGNOSIS — G8929 Other chronic pain: Secondary | ICD-10-CM | POA: Diagnosis present

## 2019-12-23 DIAGNOSIS — E876 Hypokalemia: Secondary | ICD-10-CM | POA: Diagnosis present

## 2019-12-23 DIAGNOSIS — R81 Glycosuria: Secondary | ICD-10-CM | POA: Diagnosis present

## 2019-12-23 HISTORY — DX: Other intervertebral disc degeneration, lumbar region: M51.36

## 2019-12-23 HISTORY — DX: Dizziness and giddiness: R42

## 2019-12-23 HISTORY — DX: Essential (primary) hypertension: I10

## 2019-12-23 HISTORY — DX: Cerebral infarction, unspecified: I63.9

## 2019-12-23 LAB — COMPREHENSIVE METABOLIC PANEL
ALT: 11 U/L (ref 0–44)
AST: 14 U/L — ABNORMAL LOW (ref 15–41)
Albumin: 2.7 g/dL — ABNORMAL LOW (ref 3.5–5.0)
Alkaline Phosphatase: 52 U/L (ref 38–126)
Anion gap: 11 (ref 5–15)
BUN: 37 mg/dL — ABNORMAL HIGH (ref 8–23)
CO2: 25 mmol/L (ref 22–32)
Calcium: 7.7 mg/dL — ABNORMAL LOW (ref 8.9–10.3)
Chloride: 99 mmol/L (ref 98–111)
Creatinine, Ser: 2.61 mg/dL — ABNORMAL HIGH (ref 0.44–1.00)
GFR calc Af Amer: 22 mL/min — ABNORMAL LOW (ref >60)
GFR calc non Af Amer: 19 mL/min — ABNORMAL LOW (ref >60)
Glucose, Bld: 171 mg/dL — ABNORMAL HIGH (ref 70–99)
Potassium: 3.1 mmol/L — ABNORMAL LOW (ref 3.5–5.1)
Sodium: 135 mmol/L (ref 135–145)
Total Bilirubin: 0.2 mg/dL — ABNORMAL LOW (ref 0.3–1.2)
Total Protein: 5.7 g/dL — ABNORMAL LOW (ref 6.5–8.1)

## 2019-12-23 LAB — CBC WITH DIFFERENTIAL/PLATELET
Abs Immature Granulocytes: 0.02 10*3/uL (ref 0.00–0.07)
Basophils Absolute: 0 10*3/uL (ref 0.0–0.1)
Basophils Relative: 0 %
Eosinophils Absolute: 0.2 10*3/uL (ref 0.0–0.5)
Eosinophils Relative: 2 %
HCT: 32.8 % — ABNORMAL LOW (ref 36.0–46.0)
Hemoglobin: 10.2 g/dL — ABNORMAL LOW (ref 12.0–15.0)
Immature Granulocytes: 0 %
Lymphocytes Relative: 65 %
Lymphs Abs: 6.2 10*3/uL — ABNORMAL HIGH (ref 0.7–4.0)
MCH: 27.4 pg (ref 26.0–34.0)
MCHC: 31.1 g/dL (ref 30.0–36.0)
MCV: 88.2 fL (ref 80.0–100.0)
Monocytes Absolute: 1 10*3/uL (ref 0.1–1.0)
Monocytes Relative: 11 %
Neutro Abs: 2.1 10*3/uL (ref 1.7–7.7)
Neutrophils Relative %: 22 %
Platelets: 156 10*3/uL (ref 150–400)
RBC: 3.72 MIL/uL — ABNORMAL LOW (ref 3.87–5.11)
RDW: 13.2 % (ref 11.5–15.5)
WBC: 9.7 10*3/uL (ref 4.0–10.5)
nRBC: 0 % (ref 0.0–0.2)

## 2019-12-23 LAB — URINALYSIS, ROUTINE W REFLEX MICROSCOPIC
Bilirubin Urine: NEGATIVE
Glucose, UA: >=500 mg/dL — AB
Hgb urine dipstick: NEGATIVE
Ketones, ur: NEGATIVE mg/dL
Nitrite: NEGATIVE
Protein, ur: NEGATIVE mg/dL
Specific Gravity, Urine: 1.01 (ref 1.005–1.030)
pH: 5.5 (ref 5.0–8.0)

## 2019-12-23 LAB — SARS CORONAVIRUS 2 BY RT PCR (HOSPITAL ORDER, PERFORMED IN ~~LOC~~ HOSPITAL LAB): SARS Coronavirus 2: NEGATIVE

## 2019-12-23 LAB — RAPID URINE DRUG SCREEN, HOSP PERFORMED
Amphetamines: NOT DETECTED
Barbiturates: NOT DETECTED
Benzodiazepines: NOT DETECTED
Cocaine: NOT DETECTED
Opiates: NOT DETECTED
Tetrahydrocannabinol: POSITIVE — AB

## 2019-12-23 LAB — URINALYSIS, MICROSCOPIC (REFLEX): RBC / HPF: NONE SEEN RBC/hpf (ref 0–5)

## 2019-12-23 LAB — CBG MONITORING, ED: Glucose-Capillary: 168 mg/dL — ABNORMAL HIGH (ref 70–99)

## 2019-12-23 LAB — ETHANOL: Alcohol, Ethyl (B): <10 mg/dL (ref <10)

## 2019-12-23 LAB — TROPONIN I (HIGH SENSITIVITY)
Troponin I (High Sensitivity): 5 ng/L (ref <18)
Troponin I (High Sensitivity): 6 ng/L (ref <18)

## 2019-12-23 LAB — LIPASE, BLOOD: Lipase: 22 U/L (ref 11–51)

## 2019-12-23 LAB — CK: Total CK: 139 U/L (ref 38–234)

## 2019-12-23 MED ORDER — SODIUM CHLORIDE 0.9 % IV SOLN
1.0000 g | Freq: Once | INTRAVENOUS | Status: AC
  Administered 2019-12-23: 1 g via INTRAVENOUS
  Filled 2019-12-23: qty 10

## 2019-12-23 MED ORDER — LACTATED RINGERS IV BOLUS
1000.0000 mL | Freq: Once | INTRAVENOUS | Status: AC
  Administered 2019-12-23: 1000 mL via INTRAVENOUS

## 2019-12-23 MED ORDER — METOCLOPRAMIDE HCL 5 MG/ML IJ SOLN
10.0000 mg | Freq: Once | INTRAMUSCULAR | Status: AC
  Administered 2019-12-23: 10 mg via INTRAVENOUS
  Filled 2019-12-23: qty 2

## 2019-12-23 MED ORDER — POTASSIUM CHLORIDE CRYS ER 20 MEQ PO TBCR
40.0000 meq | EXTENDED_RELEASE_TABLET | Freq: Once | ORAL | Status: AC
  Administered 2019-12-23: 40 meq via ORAL
  Filled 2019-12-23: qty 2

## 2019-12-23 MED ORDER — LACTATED RINGERS IV SOLN: INTRAVENOUS | Status: DC

## 2019-12-23 MED ORDER — DIPHENHYDRAMINE HCL 50 MG/ML IJ SOLN
25.0000 mg | Freq: Once | INTRAMUSCULAR | Status: AC
  Administered 2019-12-23: 25 mg via INTRAVENOUS
  Filled 2019-12-23: qty 1

## 2019-12-23 NOTE — ED Provider Notes (Signed)
[facility] EMERGENCY DEPARTMENT Provider Note   CSN: [id number] Arrival date & time: 12/23/19  1704     History No chief complaint on file.   Sheri Oconnell is a 64 y.o. female.  HPI 64 year old female presents with a chief complaint of headache and dizziness.  Started about 3 days ago.  The headache and dizziness seem to come and go and mostly come when she stands up.  Headache is frontal.  At times it is severe.  Sometimes has vomiting with the headache.  She is also been having some diarrhea for the last few days.  She denies a fever though does have a little bit of a cough.  Occasionally she will have some blurry vision.   She has left-sided weakness from a prior stroke but over the last week she feels like her left leg is weaker than normalAnd her speech seems to be slurred compared to her baseline.  EMS noted her blood pressure to be in the 70s and gave her 1500 mL IV fluid.  Patient states she has been sitting outside a lot though she sits in the shade. Decreased urine output, last urinated last night. Some on and off abdominal pain.   Past Medical History:  Diagnosis Date  . Hypertension   . Stroke [facility])     Patient Active Problem List   Diagnosis Date Noted  . Acute kidney injury ([place]) 12/23/2019    History reviewed. No pertinent surgical history.   OB History   No obstetric history on file.     No family history on file.  Social History   Tobacco Use  . Smoking status: Current Some Day Smoker    Packs/day: 1.00    Types: Cigarettes  . Smokeless tobacco: Former Network engineer Use Topics  . Alcohol use: Not Currently  . Drug use: Not Currently    Home Medications Prior to Admission medications   Medication Sig Start Date End Date Taking? Authorizing Provider  amLODipine (NORVASC) 5 MG tablet Take 1 tablet by mouth daily. 04/05/19  Yes [provider]  Cholecalciferol 25 MCG (1000 UT) capsule Take by mouth. 09/14/15  Yes [provider]  cloNIDine (CATAPRES) 0.2 MG tablet Take by mouth. 11/08/19 11/07/20 Yes [provider]  divalproex (DEPAKOTE ER) 500 MG 24 hr tablet Take by mouth. 07/29/19  Yes [provider]  fluticasone (FLONASE) 50 MCG/ACT nasal spray 1 spray by Each Nare route daily. 05/03/15  Yes [provider]  insulin glargine (LANTUS SOLOSTAR) 100 UNIT/ML Solostar Pen Inject 40 units nightly. ICD 10 Code E11.65 10/21/19  Yes [provider]  insulin lispro (HUMALOG KWIKPEN) 100 UNIT/ML KwikPen Inject into the skin. 11/23/17  Yes [provider]  losartan (COZAAR) 100 MG tablet Take by mouth. 12/10/18  Yes [provider]  rosuvastatin (CRESTOR) 40 MG tablet Take 1 tablet by mouth every evening. 07/25/16  Yes [provider]  varenicline (CHANTIX) 1 MG tablet Take 1 tablet by mouth 2 (two) times daily. 08/25/19  Yes [provider]  clopidogrel (PLAVIX) 75 MG tablet Take by mouth.    [provider]  ondansetron (ZOFRAN) 4 MG tablet Take by mouth.    [provider]    Allergies    Sulfamethoxazole-trimethoprim and Morphine  Review of Systems   Review of Systems  Constitutional: Negative for fever.  Respiratory: Positive for cough. Negative for shortness of breath.   Cardiovascular: Negative for chest pain.  Gastrointestinal: Positive for abdominal  pain, diarrhea and vomiting.  Genitourinary: Positive for decreased urine volume. Negative for dysuria.  Neurological: Positive for dizziness and headaches.  All other systems reviewed and are negative.   Physical Exam Updated Vital Signs BP 124/77 (BP Location: Right Arm)   Pulse 63   Temp 97.6 F (36.4 C) (Oral)   Resp 20   Wt 70 kg   SpO2 100%   Physical Exam Vitals and nursing note reviewed.  Constitutional:      General: She is not in acute distress.    Appearance: She is well-developed. She is not ill-appearing or diaphoretic.  HENT:     Head:  Normocephalic and atraumatic.     Right Ear: External ear normal.     Left Ear: External ear normal.     Nose: Nose normal.  Eyes:     General:        Right eye: No discharge.        Left eye: No discharge.     Extraocular Movements: Extraocular movements intact.     Pupils: Pupils are equal, round, and reactive to light.  Cardiovascular:     Rate and Rhythm: Normal rate and regular rhythm.     Heart sounds: Normal heart sounds.  Pulmonary:     Effort: Pulmonary effort is normal. No tachypnea, accessory muscle usage or respiratory distress.     Breath sounds: Examination of the right-lower field reveals rales. Examination of the left-lower field reveals rales. Rales present.  Abdominal:     Palpations: Abdomen is soft.     Tenderness: There is abdominal tenderness (mild, generalized).  Skin:    General: Skin is warm and dry.  Neurological:     Mental Status: She is alert and oriented to person, place, and time.     Comments: CN 3-12 grossly intact. Mildly slurred speech. 5/5 strength right upper and lower extremities. 4/5 strength left upper and left lower extremities. Grossly normal sensation. Normal finger to nose.   Psychiatric:        Mood and Affect: Mood is not anxious.     ED Results / Procedures / Treatments   Labs (all labs ordered are listed, but only abnormal results are displayed) Labs Reviewed  COMPREHENSIVE METABOLIC PANEL - Abnormal; Notable for the following components:      Result Value   Potassium 3.1 (*)    Glucose, Bld 171 (*)    BUN 37 (*)    Creatinine, Ser 2.61 (*)    Calcium 7.7 (*)    Total Protein 5.7 (*)    Albumin 2.7 (*)    AST 14 (*)    Total Bilirubin 0.2 (*)    GFR calc non Af Amer 19 (*)    GFR calc Af Amer 22 (*)    All other components within normal limits  CBC WITH DIFFERENTIAL/PLATELET - Abnormal; Notable for the following components:   RBC 3.72 (*)    Hemoglobin 10.2 (*)    HCT 32.8 (*)    Lymphs Abs 6.2 (*)    All other  components within normal limits  RAPID URINE DRUG SCREEN, HOSP PERFORMED - Abnormal; Notable for the following components:   Tetrahydrocannabinol POSITIVE (*)    All other components within normal limits  URINALYSIS, ROUTINE W REFLEX MICROSCOPIC - Abnormal; Notable for the following components:   APPearance CLOUDY (*)    Glucose, UA >=500 (*)    Leukocytes,Ua SMALL (*)    All other components within normal limits  URINALYSIS, MICROSCOPIC (  REFLEX) - Abnormal; Notable for the following components:   Bacteria, UA MANY (*)    All other components within normal limits  CBG MONITORING, ED - Abnormal; Notable for the following components:   Glucose-Capillary 168 (*)    All other components within normal limits  SARS CORONAVIRUS 2 BY RT PCR (HOSPITAL ORDER, [facility])  URINE CULTURE  ETHANOL  LIPASE, BLOOD  CK  TROPONIN I (HIGH SENSITIVITY)  TROPONIN I (HIGH SENSITIVITY)    EKG EKG Interpretation  Date/Time:  Friday December 23 2019 17:17:03 EDT Ventricular Rate:  58 PR Interval:    QRS Duration: 88 QT Interval:  450 QTC Calculation: 442 R Axis:   -10 Text Interpretation: Sinus rhythm LVH with secondary repolarization abnormality Confirmed by Sherwood Gambler [phone]) on 12/23/2019 5:21:02 PM   Radiology CT ABDOMEN PELVIS WO CONTRAST  Result Date: 12/23/2019 CLINICAL DATA:  64 year old female with abdominal pain. EXAM: CT ABDOMEN AND PELVIS WITHOUT CONTRAST TECHNIQUE: Multidetector CT imaging of the abdomen and pelvis was performed following the standard protocol without IV contrast. COMPARISON:  CT abdomen pelvis dated 10/20/2019. FINDINGS: Evaluation of this exam is limited in the absence of intravenous contrast. Lower chest: There are bibasilar and streaky atelectasis. There is coronary vascular calcification. No intra-abdominal free air or free fluid. Hepatobiliary: The liver is unremarkable. Cholecystectomy. Pancreas: Unremarkable. No pancreatic ductal  dilatation or surrounding inflammatory changes. Spleen: Normal in size without focal abnormality. Adrenals/Urinary Tract: The adrenal glands unremarkable. There is mild bilateral hydronephrosis, and mild bilateral hydroureter, new since the prior CT. There is a punctate nonobstructing left renal upper pole calculus. No stone noted in the right kidney. No ureteral calculi identified. There is a 3 cm right renal cyst. The urinary bladder is mildly distended and appears unremarkable. Stomach/Bowel: There is no bowel obstruction or active inflammation. The appendix is not visualized with certainty. No inflammatory changes identified in the right lower quadrant. Vascular/Lymphatic: Mild aortoiliac atherosclerotic disease. The IVC is unremarkable. No portal venous gas. There is no adenopathy. Reproductive: Hysterectomy. Other: Midline vertical anterior abdominal wall incisional scar. There is a small fat containing supraumbilical ventral hernia. No inflammatory changes. Musculoskeletal: Osteopenia with degenerative changes of spine. No acute osseous pathology. Mild chronic T11 superior endplate compression fracture. IMPRESSION: 1. Mild bilateral hydronephroureter, new since the prior CT. No obstructing renal or ureteral calculi identified. 2. No bowel obstruction. 3. Aortic Atherosclerosis (ICD10-I70.0). Electronically Signed   By: Anner Crete M.D.   On: 12/23/2019 20:32   CT Head Wo Contrast  Result Date: 12/23/2019 CLINICAL DATA:  Headache, suspected intracranial hemorrhage. EXAM: CT HEAD WITHOUT CONTRAST TECHNIQUE: Contiguous axial images were obtained from the base of the skull through the vertex without intravenous contrast. COMPARISON:  Oct 21, 2019 FINDINGS: Brain: No evidence of acute infarction, hemorrhage, hydrocephalus, extra-axial collection or mass lesion/mass effect. Atrophy with signs of chronic microvascular ischemic changes and prior lacunar infarcts in bilateral basal ganglia RIGHT greater than  LEFT as before. Vascular: No hyperdense vessel or unexpected calcification. Skull: Normal. Negative for fracture or focal lesion. Sinuses/Orbits: Visualized paranasal sinuses and orbits are unremarkable. Other: None. IMPRESSION: 1. No acute intracranial pathology. 2. Atrophy with signs of chronic microvascular ischemic changes and prior lacunar infarcts in bilateral basal ganglia RIGHT greater than LEFT as before. Electronically Signed   By: Zetta Bills M.D.   On: 12/23/2019 18:41   DG Chest Portable 1 View  Result Date: 12/23/2019 CLINICAL DATA:  Cough, dizzy EXAM: PORTABLE CHEST 1 VIEW  COMPARISON:  12/10/2017 FINDINGS: Chronic elevation of right diaphragm. No consolidation or effusion. Stable cardiomediastinal silhouette with aortic atherosclerosis. No pneumothorax. IMPRESSION: No active disease. Electronically Signed   By: Donavan Foil M.D.   On: 12/23/2019 18:34    Procedures Procedures (including critical care time)  Medications Ordered in ED Medications  lactated ringers infusion ( Intravenous New Bag/Given 12/23/19 1919)  lactated ringers bolus 1,000 mL (0 mLs Intravenous Stopped 12/23/19 1900)  metoCLOPramide (REGLAN) injection 10 mg (10 mg Intravenous Given 12/23/19 1741)  diphenhydrAMINE (BENADRYL) injection 25 mg (25 mg Intravenous Given 12/23/19 1743)  cefTRIAXone (ROCEPHIN) 1 g in sodium chloride 0.9 % 100 mL IVPB (0 g Intravenous Stopped 12/23/19 2006)  potassium chloride SA (KLOR-CON) CR tablet 40 mEq (40 mEq Oral Given 12/23/19 1926)    ED Course  I have reviewed the triage vital signs and the nursing notes.  Pertinent labs & imaging results that were available during my care of the patient were reviewed by me and considered in my medical decision making (see chart for details).    MDM Rules/Calculators/A&P                          Patient is not hypotensive in the ED.  She does have an acute kidney injury, especially compared to care everywhere records, which show  creatinine of 0.9 1 week ago.  Given IV fluids and then started on infusion.  CT obtained given vague abdominal pain which is fairly unremarkable except for some hydronephrosis.  She does have acute urinary retention.  She is able to urinate some but still has retention and Foley catheter was placed with over 1 L out.  Likely contributing to her AKI.  CT was obtained for her weakness, but likely her left-sided weakness is more prominent because of generalized weakness and her prior stroke.  Discussed with hospitalist, Dr. Cyd Silence, who accepts for admission.  Final Clinical Impression(s) / ED Diagnoses Final diagnoses:  Acute kidney injury ([place])  Acute urinary retention    Rx / DC Orders ED Discharge Orders    None       Sherwood Gambler, MD 12/23/19 2359

## 2019-12-23 NOTE — ED Notes (Signed)
CBG done by another tech

## 2019-12-23 NOTE — ED Triage Notes (Signed)
Ems transport from home for dizzy, nausea for 3 days. EMS states B/P 76/50 on arrival. Gave 1500 ML NS Bolus pta. B/P 107/65 on arrival. Alert. EMS states PT sits outside all day in the heat.

## 2019-12-24 ENCOUNTER — Encounter (HOSPITAL_BASED_OUTPATIENT_CLINIC_OR_DEPARTMENT_OTHER): Payer: Self-pay | Admitting: Internal Medicine

## 2019-12-24 DIAGNOSIS — M549 Dorsalgia, unspecified: Secondary | ICD-10-CM | POA: Diagnosis present

## 2019-12-24 DIAGNOSIS — N136 Pyonephrosis: Secondary | ICD-10-CM | POA: Diagnosis present

## 2019-12-24 DIAGNOSIS — Z7902 Long term (current) use of antithrombotics/antiplatelets: Secondary | ICD-10-CM | POA: Diagnosis not present

## 2019-12-24 DIAGNOSIS — Z794 Long term (current) use of insulin: Secondary | ICD-10-CM | POA: Diagnosis not present

## 2019-12-24 DIAGNOSIS — Z20822 Contact with and (suspected) exposure to covid-19: Secondary | ICD-10-CM | POA: Diagnosis present

## 2019-12-24 DIAGNOSIS — Z882 Allergy status to sulfonamides status: Secondary | ICD-10-CM | POA: Diagnosis not present

## 2019-12-24 DIAGNOSIS — G8929 Other chronic pain: Secondary | ICD-10-CM | POA: Diagnosis present

## 2019-12-24 DIAGNOSIS — R81 Glycosuria: Secondary | ICD-10-CM | POA: Diagnosis present

## 2019-12-24 DIAGNOSIS — Z79899 Other long term (current) drug therapy: Secondary | ICD-10-CM | POA: Diagnosis not present

## 2019-12-24 DIAGNOSIS — R339 Retention of urine, unspecified: Secondary | ICD-10-CM | POA: Diagnosis present

## 2019-12-24 DIAGNOSIS — Z885 Allergy status to narcotic agent status: Secondary | ICD-10-CM | POA: Diagnosis not present

## 2019-12-24 DIAGNOSIS — D649 Anemia, unspecified: Secondary | ICD-10-CM | POA: Diagnosis present

## 2019-12-24 DIAGNOSIS — E860 Dehydration: Secondary | ICD-10-CM | POA: Diagnosis present

## 2019-12-24 DIAGNOSIS — F17210 Nicotine dependence, cigarettes, uncomplicated: Secondary | ICD-10-CM | POA: Diagnosis present

## 2019-12-24 DIAGNOSIS — R519 Headache, unspecified: Secondary | ICD-10-CM | POA: Diagnosis present

## 2019-12-24 DIAGNOSIS — Z8673 Personal history of transient ischemic attack (TIA), and cerebral infarction without residual deficits: Secondary | ICD-10-CM | POA: Diagnosis not present

## 2019-12-24 DIAGNOSIS — N179 Acute kidney failure, unspecified: Secondary | ICD-10-CM | POA: Diagnosis present

## 2019-12-24 DIAGNOSIS — E876 Hypokalemia: Secondary | ICD-10-CM | POA: Diagnosis present

## 2019-12-24 DIAGNOSIS — E1165 Type 2 diabetes mellitus with hyperglycemia: Secondary | ICD-10-CM | POA: Diagnosis present

## 2019-12-24 DIAGNOSIS — I10 Essential (primary) hypertension: Secondary | ICD-10-CM | POA: Diagnosis present

## 2019-12-24 DIAGNOSIS — I959 Hypotension, unspecified: Secondary | ICD-10-CM | POA: Diagnosis present

## 2019-12-24 LAB — CBG MONITORING, ED
Glucose-Capillary: 381 mg/dL — ABNORMAL HIGH (ref 70–99)
Glucose-Capillary: 343 mg/dL — ABNORMAL HIGH (ref 70–99)
Glucose-Capillary: 166 mg/dL — ABNORMAL HIGH (ref 70–99)
Glucose-Capillary: 212 mg/dL — ABNORMAL HIGH (ref 70–99)

## 2019-12-24 MED ORDER — INSULIN ASPART 100 UNIT/ML ~~LOC~~ SOLN
0.0000 [IU] | Freq: Three times a day (TID) | SUBCUTANEOUS | Status: DC
  Administered 2019-12-24: 15 [IU] via SUBCUTANEOUS
  Filled 2019-12-24: qty 15

## 2019-12-24 MED ORDER — INSULIN LISPRO (1 UNIT DIAL) 100 UNIT/ML (KWIKPEN)
40.0000 [IU] | PEN_INJECTOR | Freq: Every day | SUBCUTANEOUS | Status: DC
  Filled 2019-12-24: qty 3

## 2019-12-24 MED ORDER — ACETAMINOPHEN 325 MG PO TABS
650.0000 mg | ORAL_TABLET | Freq: Once | ORAL | Status: AC
  Administered 2019-12-24: 650 mg via ORAL
  Filled 2019-12-24: qty 2

## 2019-12-24 MED ORDER — AMLODIPINE BESYLATE 5 MG PO TABS
5.0000 mg | ORAL_TABLET | Freq: Every day | ORAL | Status: DC
  Administered 2019-12-24 – 2019-12-25 (×2): 5 mg via ORAL
  Filled 2019-12-24 (×2): qty 1

## 2019-12-24 MED ORDER — LOSARTAN POTASSIUM 50 MG PO TABS
50.0000 mg | ORAL_TABLET | Freq: Every day | ORAL | Status: DC
  Administered 2019-12-24: 50 mg via ORAL
  Filled 2019-12-24: qty 2

## 2019-12-24 MED ORDER — CLOPIDOGREL BISULFATE 75 MG PO TABS
75.0000 mg | ORAL_TABLET | Freq: Every day | ORAL | Status: DC
  Administered 2019-12-24 – 2019-12-25 (×2): 75 mg via ORAL
  Filled 2019-12-24 (×2): qty 1

## 2019-12-24 NOTE — ED Notes (Signed)
Pt asked for food, given frozen food to patient. Allowed by Juliane Lack.

## 2019-12-24 NOTE — ED Notes (Addendum)
Pt's husband Sena Hitch updated on plan of care per pt request. Contact # [phone]

## 2019-12-24 NOTE — ED Notes (Signed)
Pt offered lunch, pt given a frozen food. [place] notified.

## 2019-12-24 NOTE — ED Notes (Signed)
Care assumed at this time.

## 2019-12-24 NOTE — ED Notes (Signed)
Pt.s CBG takne, as requested by Joss, RN. CBG is 381. RN notified.

## 2019-12-24 NOTE — ED Notes (Signed)
Pt. Given macaroni and Cheese approved by [employer]

## 2019-12-24 NOTE — ED Notes (Signed)
[facility] notified Sheri Oconnell) - patient ready for transport to [facility] ED

## 2019-12-24 NOTE — ED Notes (Signed)
Dr. Rex Kras made aware of pt's CBG and no standing orders for coverage. Also that ordered lispro Claiborne Rigg is not available

## 2019-12-25 ENCOUNTER — Encounter (HOSPITAL_COMMUNITY): Payer: Self-pay | Admitting: Internal Medicine

## 2019-12-25 ENCOUNTER — Inpatient Hospital Stay (HOSPITAL_COMMUNITY): Payer: 59

## 2019-12-25 DIAGNOSIS — N179 Acute kidney failure, unspecified: Principal | ICD-10-CM

## 2019-12-25 DIAGNOSIS — N390 Urinary tract infection, site not specified: Secondary | ICD-10-CM

## 2019-12-25 DIAGNOSIS — R42 Dizziness and giddiness: Secondary | ICD-10-CM

## 2019-12-25 DIAGNOSIS — R338 Other retention of urine: Secondary | ICD-10-CM

## 2019-12-25 DIAGNOSIS — I959 Hypotension, unspecified: Secondary | ICD-10-CM

## 2019-12-25 LAB — BASIC METABOLIC PANEL
Anion gap: 13 (ref 5–15)
BUN: 23 mg/dL (ref 8–23)
CO2: 30 mmol/L (ref 22–32)
Calcium: 9.7 mg/dL (ref 8.9–10.3)
Chloride: 95 mmol/L — ABNORMAL LOW (ref 98–111)
Creatinine, Ser: 0.87 mg/dL (ref 0.44–1.00)
GFR calc Af Amer: >60 mL/min (ref >60)
GFR calc non Af Amer: >60 mL/min (ref >60)
Glucose, Bld: 264 mg/dL — ABNORMAL HIGH (ref 70–99)
Potassium: 3.7 mmol/L (ref 3.5–5.1)
Sodium: 138 mmol/L (ref 135–145)

## 2019-12-25 LAB — CBC
HCT: 41.5 % (ref 36.0–46.0)
Hemoglobin: 13.3 g/dL (ref 12.0–15.0)
MCH: 27.9 pg (ref 26.0–34.0)
MCHC: 32 g/dL (ref 30.0–36.0)
MCV: 87 fL (ref 80.0–100.0)
Platelets: 196 10*3/uL (ref 150–400)
RBC: 4.77 MIL/uL (ref 3.87–5.11)
RDW: 13 % (ref 11.5–15.5)
WBC: 8.2 10*3/uL (ref 4.0–10.5)
nRBC: 0 % (ref 0.0–0.2)

## 2019-12-25 LAB — HEMOGLOBIN A1C
Hgb A1c MFr Bld: 12.7 % — ABNORMAL HIGH (ref 4.8–5.6)
Mean Plasma Glucose: 317.79 mg/dL

## 2019-12-25 LAB — URINE CULTURE

## 2019-12-25 LAB — VITAMIN B12: Vitamin B-12: 906 pg/mL (ref 180–914)

## 2019-12-25 LAB — CBG MONITORING, ED
Glucose-Capillary: 292 mg/dL — ABNORMAL HIGH (ref 70–99)
Glucose-Capillary: 250 mg/dL — ABNORMAL HIGH (ref 70–99)

## 2019-12-25 LAB — RETICULOCYTES
Immature Retic Fract: 10.5 % (ref 2.3–15.9)
RBC.: 4.66 MIL/uL (ref 3.87–5.11)
Retic Count, Absolute: 41.9 10*3/uL (ref 19.0–186.0)
Retic Ct Pct: 0.9 % (ref 0.4–3.1)

## 2019-12-25 LAB — CREATININE, URINE, RANDOM: Creatinine, Urine: 26.63 mg/dL

## 2019-12-25 LAB — FOLATE: Folate: 7.6 ng/mL (ref >5.9)

## 2019-12-25 LAB — HIV ANTIBODY (ROUTINE TESTING W REFLEX): HIV Screen 4th Generation wRfx: NONREACTIVE

## 2019-12-25 LAB — IRON AND TIBC
Iron: 86 ug/dL (ref 28–170)
Saturation Ratios: 24 % (ref 10.4–31.8)
TIBC: 353 ug/dL (ref 250–450)
UIBC: 267 ug/dL

## 2019-12-25 LAB — FERRITIN: Ferritin: 266 ng/mL (ref 11–307)

## 2019-12-25 LAB — SODIUM, URINE, RANDOM: Sodium, Ur: 151 mmol/L

## 2019-12-25 MED ORDER — INSULIN GLARGINE 100 UNIT/ML ~~LOC~~ SOLN
30.0000 [IU] | Freq: Every day | SUBCUTANEOUS | Status: DC
  Filled 2019-12-25: qty 0.3

## 2019-12-25 MED ORDER — SODIUM CHLORIDE 0.9 % IV SOLN
1.0000 g | Freq: Every day | INTRAVENOUS | Status: DC
  Administered 2019-12-25: 1 g via INTRAVENOUS
  Filled 2019-12-25: qty 10

## 2019-12-25 MED ORDER — ACETAMINOPHEN 650 MG RE SUPP: 650.0000 mg | Freq: Four times a day (QID) | RECTAL | Status: DC | PRN

## 2019-12-25 MED ORDER — HEPARIN SODIUM (PORCINE) 5000 UNIT/ML IJ SOLN
5000.0000 [IU] | Freq: Three times a day (TID) | INTRAMUSCULAR | Status: DC
  Administered 2019-12-25: 5000 [IU] via SUBCUTANEOUS
  Filled 2019-12-25 (×2): qty 1

## 2019-12-25 MED ORDER — NICOTINE 21 MG/24HR TD PT24: 21.0000 mg | MEDICATED_PATCH | Freq: Every day | TRANSDERMAL | 0 refills | Status: AC

## 2019-12-25 MED ORDER — POLYETHYLENE GLYCOL 3350 17 G PO PACK
17.0000 g | PACK | Freq: Every day | ORAL | Status: DC
  Administered 2019-12-25: 17 g via ORAL
  Filled 2019-12-25: qty 1

## 2019-12-25 MED ORDER — NICOTINE 21 MG/24HR TD PT24: 21.0000 mg | MEDICATED_PATCH | Freq: Every day | TRANSDERMAL | Status: DC

## 2019-12-25 MED ORDER — SODIUM CHLORIDE 0.9 % IV SOLN: INTRAVENOUS | Status: DC

## 2019-12-25 MED ORDER — BISACODYL 10 MG RE SUPP: 10.0000 mg | Freq: Every day | RECTAL | Status: DC | PRN

## 2019-12-25 MED ORDER — CLONIDINE HCL 0.1 MG PO TABS
0.2000 mg | ORAL_TABLET | Freq: Two times a day (BID) | ORAL | Status: DC
  Administered 2019-12-25 (×2): 0.2 mg via ORAL
  Filled 2019-12-25 (×2): qty 2

## 2019-12-25 MED ORDER — INSULIN ASPART 100 UNIT/ML ~~LOC~~ SOLN
0.0000 [IU] | Freq: Three times a day (TID) | SUBCUTANEOUS | Status: DC
  Administered 2019-12-25: 5 [IU] via SUBCUTANEOUS
  Administered 2019-12-25: 3 [IU] via SUBCUTANEOUS
  Filled 2019-12-25: qty 0.09

## 2019-12-25 MED ORDER — ROSUVASTATIN CALCIUM 20 MG PO TABS
40.0000 mg | ORAL_TABLET | Freq: Every evening | ORAL | Status: DC
  Filled 2019-12-25: qty 2

## 2019-12-25 MED ORDER — CEFDINIR 300 MG PO CAPS
300.0000 mg | ORAL_CAPSULE | Freq: Two times a day (BID) | ORAL | 0 refills | Status: AC
Start: 2019-12-25 — End: 2019-12-29

## 2019-12-25 MED ORDER — POLYETHYLENE GLYCOL 3350 17 G PO PACK: 17.0000 g | PACK | Freq: Every day | ORAL | 0 refills | Status: AC

## 2019-12-25 MED ORDER — ACETAMINOPHEN 325 MG PO TABS
650.0000 mg | ORAL_TABLET | Freq: Four times a day (QID) | ORAL | Status: DC | PRN
  Administered 2019-12-25: 650 mg via ORAL
  Filled 2019-12-25: qty 2

## 2019-12-25 MED ORDER — SENNOSIDES-DOCUSATE SODIUM 8.6-50 MG PO TABS
1.0000 | ORAL_TABLET | Freq: Two times a day (BID) | ORAL | Status: DC
  Administered 2019-12-25: 1 via ORAL
  Filled 2019-12-25: qty 1

## 2019-12-25 MED ORDER — DOCUSATE SODIUM 100 MG PO CAPS
100.0000 mg | ORAL_CAPSULE | Freq: Two times a day (BID) | ORAL | 0 refills | Status: AC
Start: 2019-12-25 — End: 2020-12-24

## 2019-12-25 NOTE — H&P (Signed)
History and Physical    Sheri Oconnell EXB:[id number] DOB: September 02, 1955 DOA: 12/23/2019  PCP: Patient, No Pcp Per Patient coming from: [facility]  Chief Complaint: Headache, dizziness, nausea  HPI: Sheri Oconnell is a 64 y.o. female with medical history significant of hypertension, stroke, vertigo, tobacco use, insulin-dependent type 2 diabetes presented to [place] ED on 7/30 with complaints of headache, dizziness, and nausea x3 days.  She was hypotensive with EMS with systolic in the 10U.  Blood pressure improved after 1.5 L fluid bolus.  EMS reported that patient sits outside all day in the heat.  Patient states prior to her going to the ED she was having dizziness and headache.  She spends most of her day sitting outside under an umbrella.  States 2 days prior to ED arrival she started having lower abdominal pain and could not urinate.  She vomited at that time but vomiting has now resolved and she is able to tolerate p.o. intake.  Denies fevers, cough, shortness of breath, or chest pain.  Denies recent antibiotic use.  She has not been on any new medications.  She takes 2 tablets of Advil every day for chronic back pain.  ED Course: Afebrile.  Not tachycardic.  Not hypotensive in the ED. labs showing no leukocytosis.  Hemoglobin 10.2 and MCV 88.  Hemoglobin 12.5 on labs done 10/25/2019 under Care Everywhere.  Potassium 3.1.  Hyperglycemic in the ED with CBG up to 300s.  BUN 37, creatinine 2.6.  Creatinine was 0.9 on labs done 12/14/2019 under Care Everywhere.  Lipase normal.  No elevation of LFTs.  Blood ethanol level undetectable.  High-sensitivity troponin negative x2.  CK normal.  SARS-CoV-2 PCR test negative.  UDS positive for THC.  UA with evidence of glucosuria, small amount of leukocytes, 0-5 WBCs, many bacteria, and hyaline casts.  Urine culture pending.  Chest x-ray showing no active disease.  Head CT negative for acute intracranial abnormality.  CT abdomen pelvis  showing mild bilateral hydronephroureter.  No obstructing renal or ureteral calculi.  No bowel obstruction.  Foley catheter was placed for acute urinary retention.  Patient received IV fluids,ceftriaxone, and potassium supplementation.    No further labs to assess renal function done in the ED since 7/30.  Review of Systems:  All systems reviewed and apart from history of presenting illness, are negative.  Past Medical History:  Diagnosis Date  . DDD (degenerative disc disease), lumbar   . Hypertension   . Stroke ([place])   . Vertigo     History reviewed. No pertinent surgical history.   reports that she has been smoking cigarettes. She has been smoking about 1.00 pack per day. She has quit using smokeless tobacco. She reports previous alcohol use. She reports previous drug use.  Allergies  Allergen Reactions  . Sulfamethoxazole-Trimethoprim Itching  . Morphine Itching    History reviewed. No pertinent family history.  Prior to Admission medications   Medication Sig Start Date End Date Taking? Authorizing Provider  acetaminophen (TYLENOL) 500 MG tablet Take 500 mg by mouth every 6 (six) hours as needed for moderate pain.   Yes [provider]  amLODipine (NORVASC) 5 MG tablet Take 1 tablet by mouth daily. 04/05/19  Yes [provider]  Cholecalciferol 25 MCG (1000 UT) capsule Take 1,000 Units by mouth daily.  09/14/15  Yes [provider]  cloNIDine (CATAPRES) 0.2 MG tablet Take 0.2 mg by mouth 2 (two) times daily.  11/08/19 11/07/20 Yes  [provider]  clopidogrel (PLAVIX) 75 MG tablet Take 75 mg by mouth daily.    Yes [provider]  divalproex (DEPAKOTE ER) 500 MG 24 hr tablet Take 1,000 mg by mouth daily.  07/29/19  Yes [provider]  fluticasone (FLONASE) 50 MCG/ACT nasal spray Place 1 spray into both nostrils daily.  05/03/15  Yes [provider]  insulin glargine (LANTUS SOLOSTAR) 100 UNIT/ML Solostar Pen Inject  30 Units into the skin at bedtime.  10/21/19  Yes [provider]  insulin lispro (HUMALOG KWIKPEN) 100 UNIT/ML KwikPen Inject 10-12 Units into the skin in the morning and at bedtime. Take 10 units in the morning and Take 12 units in the afternoon 11/23/17  Yes [provider]  losartan (COZAAR) 100 MG tablet Take 100 mg by mouth daily.  12/10/18  Yes [provider]  ondansetron (ZOFRAN) 4 MG tablet Take 4 mg by mouth every 8 (eight) hours as needed for nausea or vomiting.    Yes [provider]  rosuvastatin (CRESTOR) 40 MG tablet Take 1 tablet by mouth every evening. 07/25/16  Yes [provider]  varenicline (CHANTIX) 1 MG tablet Take 1 tablet by mouth 2 (two) times daily. 08/25/19  Yes [provider]    Physical Exam: Vitals:   12/24/19 2150 12/24/19 2202 12/24/19 2359 12/25/19 0130  BP: (!) 153/94 (!) 153/74 (!) 134/81 (!) 161/81  Pulse: 91 84 70 71  Resp: 20 16 18    Temp: 98.5 F (36.9 C)     TempSrc:      SpO2: 98% 97% 96% 97%  Weight:        Physical Exam Constitutional:      General: She is not in acute distress. HENT:     Head: Normocephalic and atraumatic.     Mouth/Throat:     Mouth: Mucous membranes are moist.     Pharynx: Oropharynx is clear.  Eyes:     Extraocular Movements: Extraocular movements intact.     Conjunctiva/sclera: Conjunctivae normal.  Cardiovascular:     Rate and Rhythm: Normal rate and regular rhythm.     Pulses: Normal pulses.  Pulmonary:     Effort: Pulmonary effort is normal. No respiratory distress.     Breath sounds: Normal breath sounds. No wheezing or rales.  Abdominal:     General: Bowel sounds are normal. There is no distension.     Palpations: Abdomen is soft.     Tenderness: There is abdominal tenderness. There is no guarding or rebound.     Comments: Suprapubic tenderness  Musculoskeletal:        General: No swelling or tenderness.     Cervical back: Normal range of motion and neck  supple.  Skin:    General: Skin is warm and dry.  Neurological:     General: No focal deficit present.     Mental Status: She is alert and oriented to person, place, and time.     Labs on Admission: I have personally reviewed following labs and imaging studies  CBC: Recent Labs  Lab 12/23/19 1727  WBC 9.7  NEUTROABS 2.1  HGB 10.2*  HCT 32.8*  MCV 88.2  PLT 456   Basic Metabolic Panel: Recent Labs  Lab 12/23/19 1727  NA 135  K 3.1*  CL 99  CO2 25  GLUCOSE 171*  BUN 37*  CREATININE 2.61*  CALCIUM 7.7*   GFR: CrCl cannot be calculated (Unknown ideal weight.). Liver Function Tests: Recent Labs  Lab 12/23/19 1727  AST 14*  ALT 11  ALKPHOS 52  BILITOT 0.2*  PROT 5.7*  ALBUMIN 2.7*   Recent Labs  Lab 12/23/19 1727  LIPASE 22   No results for input(s): AMMONIA in the last 168 hours. Coagulation Profile: No results for input(s): INR, PROTIME in the last 168 hours. Cardiac Enzymes: Recent Labs  Lab 12/23/19 1727  CKTOTAL 139   BNP (last 3 results) No results for input(s): PROBNP in the last 8760 hours. HbA1C: No results for input(s): HGBA1C in the last 72 hours. CBG: Recent Labs  Lab 12/23/19 1813 12/24/19 1644 12/24/19 1827 12/24/19 2050 12/24/19 2153  GLUCAP 168* 381* 343* 166* 212*   Lipid Profile: No results for input(s): CHOL, HDL, LDLCALC, TRIG, CHOLHDL, LDLDIRECT in the last 72 hours. Thyroid Function Tests: No results for input(s): TSH, T4TOTAL, FREET4, T3FREE, THYROIDAB in the last 72 hours. Anemia Panel: No results for input(s): VITAMINB12, FOLATE, FERRITIN, TIBC, IRON, RETICCTPCT in the last 72 hours. Urine analysis:    Component Value Date/Time   COLORURINE YELLOW 12/23/2019 1827   APPEARANCEUR CLOUDY (A) 12/23/2019 1827   LABSPEC 1.010 12/23/2019 1827   PHURINE 5.5 12/23/2019 1827   GLUCOSEU >=500 (A) 12/23/2019 1827   HGBUR NEGATIVE 12/23/2019 1827   BILIRUBINUR NEGATIVE 12/23/2019 1827   KETONESUR NEGATIVE 12/23/2019  1827   PROTEINUR NEGATIVE 12/23/2019 1827   NITRITE NEGATIVE 12/23/2019 1827   LEUKOCYTESUR SMALL (A) 12/23/2019 1827    Radiological Exams on Admission: CT ABDOMEN PELVIS WO CONTRAST  Result Date: 12/23/2019 CLINICAL DATA:  64 year old female with abdominal pain. EXAM: CT ABDOMEN AND PELVIS WITHOUT CONTRAST TECHNIQUE: Multidetector CT imaging of the abdomen and pelvis was performed following the standard protocol without IV contrast. COMPARISON:  CT abdomen pelvis dated 10/20/2019. FINDINGS: Evaluation of this exam is limited in the absence of intravenous contrast. Lower chest: There are bibasilar and streaky atelectasis. There is coronary vascular calcification. No intra-abdominal free air or free fluid. Hepatobiliary: The liver is unremarkable. Cholecystectomy. Pancreas: Unremarkable. No pancreatic ductal dilatation or surrounding inflammatory changes. Spleen: Normal in size without focal abnormality. Adrenals/Urinary Tract: The adrenal glands unremarkable. There is mild bilateral hydronephrosis, and mild bilateral hydroureter, new since the prior CT. There is a punctate nonobstructing left renal upper pole calculus. No stone noted in the right kidney. No ureteral calculi identified. There is a 3 cm right renal cyst. The urinary bladder is mildly distended and appears unremarkable. Stomach/Bowel: There is no bowel obstruction or active inflammation. The appendix is not visualized with certainty. No inflammatory changes identified in the right lower quadrant. Vascular/Lymphatic: Mild aortoiliac atherosclerotic disease. The IVC is unremarkable. No portal venous gas. There is no adenopathy. Reproductive: Hysterectomy. Other: Midline vertical anterior abdominal wall incisional scar. There is a small fat containing supraumbilical ventral hernia. No inflammatory changes. Musculoskeletal: Osteopenia with degenerative changes of spine. No acute osseous pathology. Mild chronic T11 superior endplate compression  fracture. IMPRESSION: 1. Mild bilateral hydronephroureter, new since the prior CT. No obstructing renal or ureteral calculi identified. 2. No bowel obstruction. 3. Aortic Atherosclerosis (ICD10-I70.0). Electronically Signed   By: Anner Crete M.D.   On: 12/23/2019 20:32   CT Head Wo Contrast  Result Date: 12/23/2019 CLINICAL DATA:  Headache, suspected intracranial hemorrhage. EXAM: CT HEAD WITHOUT CONTRAST TECHNIQUE: Contiguous axial images were obtained from the base of the skull through the vertex without intravenous contrast. COMPARISON:  Oct 21, 2019 FINDINGS: Brain: No evidence of acute infarction, hemorrhage, hydrocephalus, extra-axial collection or mass lesion/mass effect.  Atrophy with signs of chronic microvascular ischemic changes and prior lacunar infarcts in bilateral basal ganglia RIGHT greater than LEFT as before. Vascular: No hyperdense vessel or unexpected calcification. Skull: Normal. Negative for fracture or focal lesion. Sinuses/Orbits: Visualized paranasal sinuses and orbits are unremarkable. Other: None. IMPRESSION: 1. No acute intracranial pathology. 2. Atrophy with signs of chronic microvascular ischemic changes and prior lacunar infarcts in bilateral basal ganglia RIGHT greater than LEFT as before. Electronically Signed   By: Zetta Bills M.D.   On: 12/23/2019 18:41   DG Chest Portable 1 View  Result Date: 12/23/2019 CLINICAL DATA:  Cough, dizzy EXAM: PORTABLE CHEST 1 VIEW COMPARISON:  12/10/2017 FINDINGS: Chronic elevation of right diaphragm. No consolidation or effusion. Stable cardiomediastinal silhouette with aortic atherosclerosis. No pneumothorax. IMPRESSION: No active disease. Electronically Signed   By: Donavan Foil M.D.   On: 12/23/2019 18:34    EKG: Independently reviewed.  Sinus rhythm, nonspecific T wave abnormality.  No prior tracing for comparison.  Assessment/Plan Principal Problem:   Acute kidney injury ([place]) Active Problems:   Acute urinary retention    UTI (urinary tract infection)   Hypotension   Dizziness   AKI: Likely prerenal from severe dehydration in the setting of emesis and prolonged daily exposure to hot climate.  In addition, patient takes an ARB at home and reports daily NSAID use.  BUN 37, creatinine 2.6 on initial labs done in the ED on 7/30.  Creatinine was 0.9 on labs done 12/14/2019 under Care Everywhere.  UA with hyaline casts.  CT showing mild bilateral hydronephroureter.  No obstructing renal or ureteral calculi. -IV fluid hydration.  Repeat BMP.  Monitor renal function and urine output very closely.  Avoid nephrotoxic agents/contrast.  Hold losartan -per [facility] she received a dose at [facility] ED yesterday. Check urine sodium, creatinine.  Order renal ultrasound.  Acute urinary retention: Per ED documentation, patient had acute urinary retention and Foley was placed. -Continue Foley and monitor urine output closely  Possible UTI: No fever, leukocytosis, or signs of sepsis.  UA with small amount of leukocytes, 0-5 WBCs, and many bacteria.  Patient is endorsing suprapubic pain. -Continue ceftriaxone.  Urine culture pending.  Hypotension (resolved): Likely due to severe dehydration.  No fever, leukocytosis, or signs of sepsis.  Hypotension noted by EMS initially on 7/30 and patient received fluid boluses.  Not hypotensive in the ED. -Continue monitor blood pressure closely  Dizziness (resolved): Likely due to severe dehydration.  Does have a history of vertigo which could also be possibly contributing.  Patient states her dizziness has now resolved. -Continue to monitor  Headache (resolved): Likely due to sitting in hot climate for prolonged periods of time and severe dehydration.  Head CT negative for acute intracranial abnormality.  Headache as now resolved. -Continue to monitor, Tylenol as needed  Emesis (resolved): No fever or leukocytosis.  Lipase normal.  No elevation of LFTs.  CT without evidence of bowel  obstruction.  UDS positive for THC which was likely contributing.  Patient is no longer vomiting and is requesting food to eat. -Continue to monitor  Hypokalemia: Likely due to poor oral intake/history of emesis.  Potassium 3.1 on labs done 2 days ago.  Patient received potassium supplementation daily. -Repeat BMP  Normocytic anemia: Hemoglobin 10.2 and MCV 88.  Hemoglobin 12.5 on labs done 10/25/2019 under Care Everywhere.  Patient is not endorsing any symptoms of GI bleed. -Anemia panel, check FOBT  Insulin-dependent type 2 diabetes: Hyperglycemic in the ED  with CBG up to 300s.  UA with evidence of glucosuria.  Most recent CBG 212. -Check A1c.  Continue home Lantus 30 units at bedtime.  Order sensitive sliding scale insulin with meals and CBG checks.  Hypertension: Slightly hypertensive with systolic in the 552Z. -Resume home amlodipine.  Resume clonidine to prevent rebound hypertension.  Hold losartan given AKI.  History of CVA -Continue Plavix and statin  Tobacco use -NicoDerm patch and counseling  DVT prophylaxis: Subcutaneous heparin Code Status: Patient wishes to be full code. Family Communication: No family available at this time. Disposition Plan: Status is: Inpatient  Remains inpatient appropriate because:IV treatments appropriate due to intensity of illness or inability to take PO and Inpatient level of care appropriate due to severity of illness   Dispo: The patient is from: Home              Anticipated d/c is to: Home              Anticipated d/c date is: 2 days              Patient currently is not medically stable to d/c.  The medical decision making on this patient was of high complexity and the patient is at high risk for clinical deterioration, therefore this is a level 3 visit.  Shela Leff MD [facility]  If 7PM-7AM, please contact night-coverage [email or web address]  12/25/2019, 2:07 AM

## 2019-12-25 NOTE — Discharge Instructions (Signed)
Acute Kidney Injury, Adult   Follow these instructions at home: Medicines  Take over-the-counter and prescription medicines only as told by your health care provider.  Do not take any new medicines without your health care provider's approval. Many medicines can worsen your kidney damage.  Do not take any vitamin and mineral supplements without your health care provider's approval. Many nutritional supplements can worsen your kidney damage. Lifestyle  If your health care provider prescribed changes to your diet, follow them. You may need to decrease the amount of protein you eat.  Achieve and maintain a healthy weight. If you need help with this, ask your health care provider.  Start or continue an exercise plan. Try to exercise at least 30 minutes a day, 5 days a week.  Do not use any tobacco products, such as cigarettes, chewing tobacco, and e-cigarettes. If you need help quitting, ask your health care provider. General instructions  Keep track of your blood pressure. Report changes in your blood pressure as told by your health care provider.  Stay up to date with immunizations. Ask your health care provider which immunizations you need.  Keep all follow-up visits as told by your health care provider. This is important. Where to find more information  American Association of Kidney Patients: BombTimer.gl  National Kidney Foundation: www.kidney.[place]: https://mathis.com/  Life Options Rehabilitation Program: ? www.lifeoptions.org ? www.kidneyschool.org Contact a health care provider if:  Your symptoms get worse.  You develop new symptoms. Get help right away if:  You develop symptoms of worsening kidney disease, which include: ? Headaches. ? Abnormally dark or light skin. ? Easy bruising. ? Frequent hiccups. ? Chest pain. ? Shortness of breath. ? End of menstruation in women. ? Seizures. ? Confusion or altered mental status. ? Abdominal or back  pain. ? Itchiness.  You have a fever.  Your body is producing less urine.  You have pain or bleeding when you urinate. Summary  Acute kidney injury is a sudden worsening of kidney function.  Acute kidney injury can be caused by problems with blood flow to the kidneys, direct damage to the kidneys, and sudden blockage of urine flow.  Symptoms of this condition may not be obvious until it becomes severe. Symptoms may include edema, lethargy, confusion, nausea or vomiting, and problems passing urine.  This condition can usually be diagnosed with blood tests, urine tests, and imaging tests. Sometimes a kidney biopsy is done to diagnose this condition.  Treatment for this condition often involves treating the underlying cause. It is treated with fluids, medicines, dialysis, diet changes, or surgery. This information is not intended to replace advice given to you by your health care provider. Make sure you discuss any questions you have with your health care provider. Document Revised: 04/24/2017 Document Reviewed: 05/02/2016 Elsevier Patient Education  2020 [employer].

## 2019-12-25 NOTE — Discharge Summary (Signed)
[facility] Discharge Summary   Patient: Sheri Oconnell GMW:[id number]  PCP: Patient, No Pcp Per  Date of admission: 12/23/2019   Date of discharge:  12/25/2019     Discharge Diagnoses:  Principal Problem:   Acute kidney injury [facility]) Active Problems:   Acute urinary retention   UTI (urinary tract infection)   Hypotension   Dizziness  Admitted From: home Disposition:  Home   Recommendations for Outpatient Follow-up:  1. PCP: Follow-up with PCP in 1 week, follow-up with urology as recommended 2. Follow up LABS/TEST:  none   Follow-up Information    PCP. Schedule an appointment as soon as possible for a visit in 1 week(s).              Diet recommendation: Cardiac diet  Activity: The patient is advised to gradually reintroduce usual activities, as tolerated  Discharge Condition: stable  Code Status: Full code   History of present illness: As per the H and P dictated on admission, " [name] is a 64 y.o. female with medical history significant of hypertension, stroke, vertigo, tobacco use, insulin-dependent type 2 diabetes presented to [place] ED on 7/30 with complaints of headache, dizziness, and nausea x3 days.  She was hypotensive with EMS with systolic in the 44I.  Blood pressure improved after 1.5 L fluid bolus.  EMS reported that patient sits outside all day in the heat.  Patient states prior to her going to the ED she was having dizziness and headache.  She spends most of her day sitting outside under an umbrella.  States 2 days prior to ED arrival she started having lower abdominal pain and could not urinate.  She vomited at that time but vomiting has now resolved and she is able to tolerate p.o. intake.  Denies fevers, cough, shortness of breath, or chest pain.  Denies recent antibiotic use.  She has not been on any new medications.  She takes 2 tablets of Advil every day for chronic back pain."  Hospital Course:  Summary of her active problems in  the hospital is as following. AKI:  Likely postrenal from obstruction but also from dehydration in the setting of emesis and prolonged daily exposure to hot climate.   In addition, patient takes an ARB at home and reports daily NSAID use. BUN 37, creatinine 2.6 on initial labs done in the ED on 7/30.   Creatinine was 0.9 on labs done 12/14/2019 under Care Everywhere.  UA with hyaline casts.   CT showing mild bilateral hydronephroureter.  No obstructing renal or ureteral calculi. Given IV fluid hydration.  Now resolved   Acute urinary retention:  Per ED documentation, patient had acute urinary retention and Foley was placed. Foley was removed and the patient was able to void without any problem. Patient had prior history of urinary retention and is scheduled to see urology outpatient.  Possible UTI:  No fever, leukocytosis, or signs of sepsis.   UA with small amount of leukocytes, 0-5 WBCs, and many bacteria. Patient is endorsing suprapubic pain. Treated with ceftriaxone.  Urine culture pending. Change to oral.  Hypotension (resolved):  Likely due to severe dehydration.   No fever, leukocytosis, or signs of sepsis.   Hypotension noted by EMS initially on 7/30 and patient received fluid boluses.  Not hypotensive in the ED.  Dizziness (resolved):  Likely due to severe dehydration.   Does have a history of vertigo which could also be possibly contributing.  Patient states her dizziness  has now resolved. Continue to monitor  Headache (resolved):  Likely due to sitting in hot climate for prolonged periods of time and severe dehydration.  Head CT negative for acute intracranial abnormality.  Headache as now resolved.  Emesis (resolved):  No fever or leukocytosis.   Lipase normal.  No elevation of LFTs.  CT without evidence of bowel obstruction.   UDS positive for THC which was likely contributing.  Patient is no longer vomiting and is requesting food to eat. Continue to  monitor  Hypokalemia:  Likely due to poor oral intake/history of emesis.  Potassium 3.1 on labs done 2 days ago.   Patient received potassium supplementation daily.  Normocytic anemia:  Hemoglobin 10.2 and MCV 88.   Hemoglobin 12.5 on labs done 10/25/2019 under Care Everywhere.  Patient is not endorsing any symptoms of GI bleed. Anemia panel, check FOBT  Insulin-dependent type 2 diabetes:  Hyperglycemic in the ED with CBG up to 300s.   UA with evidence of glucosuria. Continue home Lantus 30 units at bedtime.    Hypertension:  Slightly hypertensive with systolic in the 784O. -Resume home amlodipine.  Resume clonidine to prevent rebound hypertension.   History of CVA -Continue Plavix and statin  Tobacco use -NicoDerm patch and counseling  Patient was ambulatory without any assistance. On the day of the discharge the patient's vitals were stable, and no other acute medical condition were reported by patient. the patient was felt safe to be discharge at Home with no therapy needed on discharge.  Consultants: none Procedures: none  Discharge Exam: General: Appear in no distress, no Rash; Oral Mucosa Clear, moist. Cardiovascular: S1 and S2 Present, no Murmur, Respiratory: normal respiratory effort, Bilateral Air entry present and no Crackles, no wheezes Abdomen: Bowel Sound present, Soft and no tenderness, no hernia Extremities: no Pedal edema, no calf tenderness Neurology: alert and oriented to time, place, and person affect appropriate.  Filed Weights   12/23/19 1712  Weight: 70 kg   Vitals:   12/25/19 1310 12/25/19 1541  BP: (!) 133/60 (!) 160/72  Pulse: 49 58  Resp: 18 18  Temp:  98 F (36.7 C)  SpO2: 97% 98%   DISCHARGE MEDICATION: Allergies as of 12/25/2019      Reactions   Sulfamethoxazole-trimethoprim Itching   Morphine Itching      Medication List    TAKE these medications   acetaminophen 500 MG tablet Commonly known as: TYLENOL Take 500 mg by  mouth every 6 (six) hours as needed for moderate pain.   amLODipine 5 MG tablet Commonly known as: NORVASC Take 1 tablet by mouth daily.   cefdinir 300 MG capsule Commonly known as: OMNICEF Take 1 capsule (300 mg total) by mouth 2 (two) times daily for 4 days.   Chantix 1 MG tablet Generic drug: varenicline Take 1 tablet by mouth 2 (two) times daily.   Cholecalciferol 25 MCG (1000 UT) capsule Take 1,000 Units by mouth daily.   cloNIDine 0.2 MG tablet Commonly known as: CATAPRES Take 0.2 mg by mouth 2 (two) times daily.   clopidogrel 75 MG tablet Commonly known as: PLAVIX Take 75 mg by mouth daily.   divalproex 500 MG 24 hr tablet Commonly known as: DEPAKOTE ER Take 1,000 mg by mouth daily.   docusate sodium 100 MG capsule Commonly known as: Colace Take 1 capsule (100 mg total) by mouth 2 (two) times daily.   fluticasone 50 MCG/ACT nasal spray Commonly known as: FLONASE Place 1 spray into both nostrils daily.  HumaLOG KwikPen 100 UNIT/ML KwikPen Generic drug: insulin lispro Inject 10-12 Units into the skin in the morning and at bedtime. Take 10 units in the morning and Take 12 units in the afternoon   Lantus SoloStar 100 UNIT/ML Solostar Pen Generic drug: insulin glargine Inject 30 Units into the skin at bedtime.   losartan 100 MG tablet Commonly known as: COZAAR Take 100 mg by mouth daily.   nicotine 21 mg/24hr patch Commonly known as: NICODERM CQ - dosed in mg/24 hours Place 1 patch (21 mg total) onto the skin daily. Start taking on: December 26, 2019   ondansetron 4 MG tablet Commonly known as: ZOFRAN Take 4 mg by mouth every 8 (eight) hours as needed for nausea or vomiting.   polyethylene glycol 17 g packet Commonly known as: MIRALAX / GLYCOLAX Take 17 g by mouth daily. Start taking on: December 26, 2019   rosuvastatin 40 MG tablet Commonly known as: CRESTOR Take 1 tablet by mouth every evening.      Allergies  Allergen Reactions  .  Sulfamethoxazole-Trimethoprim Itching  . Morphine Itching   Discharge Instructions    Diet - low sodium heart healthy   Complete by: As directed    Increase activity slowly   Complete by: As directed       The results of significant diagnostics from this hospitalization (including imaging, microbiology, ancillary and laboratory) are listed below for reference.    Significant Diagnostic Studies: CT ABDOMEN PELVIS WO CONTRAST  Result Date: 12/23/2019 CLINICAL DATA:  64 year old female with abdominal pain. EXAM: CT ABDOMEN AND PELVIS WITHOUT CONTRAST TECHNIQUE: Multidetector CT imaging of the abdomen and pelvis was performed following the standard protocol without IV contrast. COMPARISON:  CT abdomen pelvis dated 10/20/2019. FINDINGS: Evaluation of this exam is limited in the absence of intravenous contrast. Lower chest: There are bibasilar and streaky atelectasis. There is coronary vascular calcification. No intra-abdominal free air or free fluid. Hepatobiliary: The liver is unremarkable. Cholecystectomy. Pancreas: Unremarkable. No pancreatic ductal dilatation or surrounding inflammatory changes. Spleen: Normal in size without focal abnormality. Adrenals/Urinary Tract: The adrenal glands unremarkable. There is mild bilateral hydronephrosis, and mild bilateral hydroureter, new since the prior CT. There is a punctate nonobstructing left renal upper pole calculus. No stone noted in the right kidney. No ureteral calculi identified. There is a 3 cm right renal cyst. The urinary bladder is mildly distended and appears unremarkable. Stomach/Bowel: There is no bowel obstruction or active inflammation. The appendix is not visualized with certainty. No inflammatory changes identified in the right lower quadrant. Vascular/Lymphatic: Mild aortoiliac atherosclerotic disease. The IVC is unremarkable. No portal venous gas. There is no adenopathy. Reproductive: Hysterectomy. Other: Midline vertical anterior abdominal  wall incisional scar. There is a small fat containing supraumbilical ventral hernia. No inflammatory changes. Musculoskeletal: Osteopenia with degenerative changes of spine. No acute osseous pathology. Mild chronic T11 superior endplate compression fracture. IMPRESSION: 1. Mild bilateral hydronephroureter, new since the prior CT. No obstructing renal or ureteral calculi identified. 2. No bowel obstruction. 3. Aortic Atherosclerosis (ICD10-I70.0). Electronically Signed   By: Anner Crete M.D.   On: 12/23/2019 20:32   CT Head Wo Contrast  Result Date: 12/23/2019 CLINICAL DATA:  Headache, suspected intracranial hemorrhage. EXAM: CT HEAD WITHOUT CONTRAST TECHNIQUE: Contiguous axial images were obtained from the base of the skull through the vertex without intravenous contrast. COMPARISON:  Oct 21, 2019 FINDINGS: Brain: No evidence of acute infarction, hemorrhage, hydrocephalus, extra-axial collection or mass lesion/mass effect. Atrophy with signs of chronic microvascular  ischemic changes and prior lacunar infarcts in bilateral basal ganglia RIGHT greater than LEFT as before. Vascular: No hyperdense vessel or unexpected calcification. Skull: Normal. Negative for fracture or focal lesion. Sinuses/Orbits: Visualized paranasal sinuses and orbits are unremarkable. Other: None. IMPRESSION: 1. No acute intracranial pathology. 2. Atrophy with signs of chronic microvascular ischemic changes and prior lacunar infarcts in bilateral basal ganglia RIGHT greater than LEFT as before. Electronically Signed   By: Zetta Bills M.D.   On: 12/23/2019 18:41   US RENAL  Result Date: 12/25/2019 CLINICAL DATA:  Acute kidney injury EXAM: RENAL / URINARY TRACT ULTRASOUND COMPLETE COMPARISON:  November 11, 2019 FINDINGS: Right Kidney: Renal measurements: 11.1 x 5.0 x 4.6 cm. Echogenicity within normal limits. There is a cyst seen within the midpole measuring 2.8 x 2.7 x 2.2 cm. No shadowing stones or hydronephrosis. Left Kidney: Renal  measurements: 11.1 x 6.0 x 4.4 cm. Echogenicity within normal limits. No mass or hydronephrosis visualized. Bladder: Foley catheter seen within a decompressed bladder. Other: None. IMPRESSION: No hydronephrosis or renal calculi. Decompressed bladder with a Foley catheter Electronically Signed   By: Prudencio Pair M.D.   On: 12/25/2019 02:58   DG Chest Portable 1 View  Result Date: 12/23/2019 CLINICAL DATA:  Cough, dizzy EXAM: PORTABLE CHEST 1 VIEW COMPARISON:  12/10/2017 FINDINGS: Chronic elevation of right diaphragm. No consolidation or effusion. Stable cardiomediastinal silhouette with aortic atherosclerosis. No pneumothorax. IMPRESSION: No active disease. Electronically Signed   By: Donavan Foil M.D.   On: 12/23/2019 18:34   Microbiology: Recent Results (from the past 240 hour(s))  SARS Coronavirus 2 by RT PCR (hospital order, performed in [facility] hospital lab) Nasopharyngeal Nasopharyngeal Swab     Status: None   Collection Time: 12/23/19  5:27 PM   Specimen: Nasopharyngeal Swab  Result Value Ref Range Status   SARS Coronavirus 2 NEGATIVE NEGATIVE Final    Comment: (NOTE) SARS-CoV-2 target nucleic acids are NOT DETECTED.  The SARS-CoV-2 RNA is generally detectable in upper and lower respiratory specimens during the acute phase of infection. The lowest concentration of SARS-CoV-2 viral copies this assay can detect is 250 copies / mL. A negative result does not preclude SARS-CoV-2 infection and should not be used as the sole basis for treatment or other patient management decisions.  A negative result may occur with improper specimen collection / handling, submission of specimen other than nasopharyngeal swab, presence of viral mutation(s) within the areas targeted by this assay, and inadequate number of viral copies (<250 copies / mL). A negative result must be combined with clinical observations, patient history, and epidemiological information.  Fact Sheet for Patients:     StrictlyIdeas.no  Fact Sheet for Healthcare Providers: BankingDealers.co.za  This test is not yet approved or  cleared by the [place] FDA and has been authorized for detection and/or diagnosis of SARS-CoV-2 by FDA under an Emergency Use Authorization (EUA).  This EUA will remain in effect (meaning this test can be used) for the duration of the COVID-19 declaration under Section 564(b)(1) of the Act, 21 U.S.C. section 360bbb-3(b)(1), unless the authorization is terminated or revoked sooner.  Performed at [facility], [address], [place], [place] [address]   Urine culture     Status: Abnormal   Collection Time: 12/23/19  6:27 PM   Specimen: Urine, Random  Result Value Ref Range Status   Specimen Description   Final    URINE, RANDOM Performed at [facility], [address] [place], [place] [address]    Special Requests   Final    NONE Performed at [facility], [address] [place], [place] [address]    Culture MULTIPLE SPECIES PRESENT, SUGGEST RECOLLECTION (A)  Final   Report Status 12/25/2019 FINAL  Final   Labs: CBC: Recent Labs  Lab 12/23/19 1727 12/25/19 0234  WBC 9.7 8.2  NEUTROABS 2.1  --   HGB 10.2* 13.3  HCT 32.8* 41.5  MCV 88.2 87.0  PLT 156 239   Basic Metabolic Panel: Recent Labs  Lab 12/23/19 1727 12/25/19 0234  NA 135 138  K 3.1* 3.7  CL 99 95*  CO2 25 30  GLUCOSE 171* 264*  BUN 37* 23  CREATININE 2.61* 0.87  CALCIUM 7.7* 9.7   Liver Function Tests: Recent Labs  Lab 12/23/19 1727  AST 14*  ALT 11  ALKPHOS 52  BILITOT 0.2*  PROT 5.7*  ALBUMIN 2.7*   Recent Labs  Lab 12/23/19 1727  LIPASE 22   No results for input(s): AMMONIA in the last 168 hours. Cardiac Enzymes: Recent Labs  Lab 12/23/19 1727  CKTOTAL 139   BNP (last 3 results) No results for input(s): BNP in the last 8760 hours. CBG: Recent Labs  Lab 12/24/19 1827  12/24/19 2050 12/24/19 2153 12/25/19 0747 12/25/19 1200  GLUCAP 343* 166* 212* 292* 250*    Time spent: 35 minutes  Signed:  Berle Mull  [facility]  12/25/2019 3:51 PM

## 2019-12-25 NOTE — ED Provider Notes (Signed)
Patient transferred from [facility] for admission to the hospital service.  Nursing staff state they have called the hospitalist who refuses to come see the patient for admission.  Patient states she still having some upper abdominal pain but she denies any nausea, vomiting, or diarrhea now.  She states she was having a lot of vomiting at home.  She states she has had this pain before in the past but no diagnosis has been found.  Patient also is requesting to eat.  12:21 AM Dr. Marlowe Sax, hospitalist will come see patient   Rolland Porter, MD 12/25/19 [phone]
